# Patient Record
Sex: Male | Born: 1975 | Race: White | Hispanic: No | Marital: Single | Smoking: Current some day smoker
Health system: Southern US, Community
[De-identification: ages and names within clinical notes are randomized; demographics above are authoritative.]

## PROBLEM LIST (undated history)

## (undated) DIAGNOSIS — M1990 Unspecified osteoarthritis, unspecified site: Secondary | ICD-10-CM

## (undated) DIAGNOSIS — K602 Anal fissure, unspecified: Secondary | ICD-10-CM

## (undated) DIAGNOSIS — K649 Unspecified hemorrhoids: Secondary | ICD-10-CM

## (undated) DIAGNOSIS — N289 Disorder of kidney and ureter, unspecified: Secondary | ICD-10-CM

## (undated) DIAGNOSIS — F419 Anxiety disorder, unspecified: Secondary | ICD-10-CM

## (undated) HISTORY — PX: OTHER SURGICAL HISTORY: SHX169

## (undated) HISTORY — DX: Unspecified hemorrhoids: K64.9

## (undated) HISTORY — DX: Disorder of kidney and ureter, unspecified: N28.9

## (undated) HISTORY — PX: GANGLION CYST EXCISION: SHX1691

## (undated) HISTORY — PX: ANUS SURGERY: SHX302

## (undated) HISTORY — DX: Anal fissure, unspecified: K60.2

---

## 2004-03-27 HISTORY — PX: OTHER SURGICAL HISTORY: SHX169

## 2004-10-20 ENCOUNTER — Emergency Department: Payer: Self-pay | Admitting: General Practice
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
## 2016-08-24 ENCOUNTER — Ambulatory Visit (INDEPENDENT_AMBULATORY_CARE_PROVIDER_SITE_OTHER): Payer: Self-pay | Admitting: Nurse Practitioner

## 2016-08-24 ENCOUNTER — Other Ambulatory Visit: Payer: Self-pay | Admitting: Nurse Practitioner

## 2016-08-24 ENCOUNTER — Encounter: Payer: Self-pay | Admitting: Nurse Practitioner

## 2016-08-24 VITALS — BP 130/87 | HR 92 | Temp 99.0°F | Ht 64.0 in | Wt 136.4 lb

## 2016-08-24 DIAGNOSIS — J069 Acute upper respiratory infection, unspecified: Secondary | ICD-10-CM | POA: Diagnosis not present

## 2016-08-24 DIAGNOSIS — Z716 Tobacco abuse counseling: Secondary | ICD-10-CM

## 2016-08-24 DIAGNOSIS — Z720 Tobacco use: Secondary | ICD-10-CM

## 2016-08-24 DIAGNOSIS — F17210 Nicotine dependence, cigarettes, uncomplicated: Secondary | ICD-10-CM

## 2016-08-24 DIAGNOSIS — Z7689 Persons encountering health services in other specified circumstances: Secondary | ICD-10-CM

## 2016-08-24 DIAGNOSIS — Z0000 Encounter for general adult medical examination without abnormal findings: Secondary | ICD-10-CM

## 2016-08-24 MED ORDER — FLUTICASONE PROPIONATE 50 MCG/ACT NA SUSP: 2.0000 | Freq: Every day | NASAL | 0 refills | Status: DC

## 2016-08-24 NOTE — Progress Notes (Signed)
I have reviewed this encounter including the documentation in this note and/or discussed this patient with the provider, Wilhelmina McardleLauren Kennedy, AGPCNP-BC. I am certifying that I agree with the content of this note as supervising physician.  Saralyn PilarAlexander Karamalegos, DO [facility] 08/24/2016, 1:59 PM

## 2016-08-24 NOTE — Patient Instructions (Signed)
Justin Short, Thank you for coming in to clinic today.  1. Smoking Cessation:  --> 1-800-Quit Now is the [place] quit smoking line you can call any time.  2. Upper respiratory Infection 1. It sounds like you have a Upper Respiratory Virus - this will most likely run it's course in 7 to 10 days. Recommend good hand washing. - Continue ipratropium/ Atrovent nasal spray decongestant 2 sprays each nostril up to 4 times daily for 5-7 days - Start anti-histamine loratadine 10mg  daily for the next 2-4 weeks.   - also can use Flonase 2 sprays each nostril daily for up to 4-6 weeks  This works best after 10-14 days continuous use. - If congestion is worse, start OTC Mucinex (or may try Mucinex-DM for cough) up to 7-10 days then stop - Drink plenty of fluids to improve congestion - Drink warm herbal tea with honey for sore throat. - Start taking Tylenol extra strength 1 to 2 tablets every 6-8 hours for aches or fever/chills for next few days as needed.  Do not take more than 3,000 mg in 24 hours from all medicines.  May take Ibuprofen as well if tolerated 200-400mg  every 8 hours as needed. Do not take for longer than 2 weeks.  If symptoms significantly worsening with persistent fevers/chills despite tylenol/ibpurofen, nausea, vomiting unable to tolerate food/fluids or medicine, body aches, or shortness of breath, sinus pain pressure or worsening productive cough, then follow-up for re-evaluation, may seek more immediate care at Urgent Care or ED if more concerned for emergency.  Please schedule a follow-up appointment with Justin Short, Justin Short to Return in about 4 weeks (around 09/21/2016) for annual physical exam Friday, Labs earlier the same week.  You will be due for FASTING BLOOD WORK (no food or drink after midnight before, only water or coffee without cream/sugar on the morning of)  - Please go ahead and schedule a "Lab Only" visit in the morning at the clinic for lab draw the week of your physical at least 2  days before next Annual Physical  At your physical we would do the following tests during an annual physical:  You can call your insurance company to find out what is covered.  IF not completely covered, as about your "cost share." TSH CMP CBC Vitamin D 25 Hydroxy Lipid panel A1c ECG - electrocardiogram (electrical pathways of heart beat).  For Lab Results, once available within 2-3 days of blood draw, you can can log in to MyChart online to view your results and a brief explanation. Also, we can discuss results at next follow-up visit.  If you have any other questions or concerns, please feel free to call the clinic or send a message through MyChart. You may also schedule an earlier appointment if necessary.  Justin McardleLauren Tilford Deaton, DNP, Justin Short-BC Adult Gerontology Nurse Practitioner [facility]   Steps to Quit Smoking Smoking tobacco can be harmful to your health and can affect almost every organ in your body. Smoking puts you, and those around you, at risk for developing many serious chronic diseases. Quitting smoking is difficult, but it is one of the best things that you can do for your health. It is never too late to quit. What are the benefits of quitting smoking? When you quit smoking, you lower your risk of developing serious diseases and conditions, such as:  Lung cancer or lung disease, such as COPD.  Heart disease.  Stroke.  Heart attack.  Infertility.  Osteoporosis and bone fractures.  Additionally, symptoms such as coughing, wheezing, and shortness of breath may get better when you quit. You may also find that you get sick less often because your body is stronger at fighting off colds and infections. If you are pregnant, quitting smoking can help to reduce your chances of having a baby of low birth weight. How do I get ready to quit? When you decide to quit smoking, create a plan to make sure that you are successful. Before you quit:  Pick a date to  quit. Set a date within the next two weeks to give you time to prepare.  Write down the reasons why you are quitting. Keep this list in places where you will see it often, such as on your bathroom mirror or in your car or wallet.  Identify the people, places, things, and activities that make you want to smoke (triggers) and avoid them. Make sure to take these actions: ? Throw away all cigarettes at home, at work, and in your car. ? Throw away smoking accessories, such as Set designer. ? Clean your car and make sure to empty the ashtray. ? Clean your home, including curtains and carpets.  Tell your family, friends, and coworkers that you are quitting. Support from your loved ones can make quitting easier.  Talk with your health care provider about your options for quitting smoking.  Find out what treatment options are covered by your health insurance.  What strategies can I use to quit smoking? Talk with your healthcare provider about different strategies to quit smoking. Some strategies include:  Quitting smoking altogether instead of gradually lessening how much you smoke over a period of time. Research shows that quitting "cold [place]" is more successful than gradually quitting.  Attending in-person counseling to help you build problem-solving skills. You are more likely to have success in quitting if you attend several counseling sessions. Even short sessions of 10 minutes can be effective.  Finding resources and support systems that can help you to quit smoking and remain smoke-free after you quit. These resources are most helpful when you use them often. They can include: ? Online chats with a Veterinary surgeon. ? Telephone quitlines. ? Automotive engineer. ? Support groups or group counseling. ? Text messaging programs. ? Mobile phone applications.  Taking medicines to help you quit smoking. (If you are pregnant or breastfeeding, talk with your health care provider  first.) Some medicines contain nicotine and some do not. Both types of medicines help with cravings, but the medicines that include nicotine help to relieve withdrawal symptoms. Your health care provider may recommend: ? Nicotine patches, gum, or lozenges. ? Nicotine inhalers or sprays. ? Non-nicotine medicine that is taken by mouth.  Talk with your health care provider about combining strategies, such as taking medicines while you are also receiving in-person counseling. Using these two strategies together makes you more likely to succeed in quitting than if you used either strategy on its own. If you are pregnant or breastfeeding, talk with your health care provider about finding counseling or other support strategies to quit smoking. Do not take medicine to help you quit smoking unless told to do so by your health care provider. What things can I do to make it easier to quit? Quitting smoking might feel overwhelming at first, but there is a lot that you can do to make it easier. Take these important actions:  Reach out to your family and friends and ask that they support and encourage you  during this time. Call telephone quitlines, reach out to support groups, or work with a counselor for support.  Ask people who smoke to avoid smoking around you.  Avoid places that trigger you to smoke, such as bars, parties, or smoke-break areas at work.  Spend time around people who do not smoke.  Lessen stress in your life, because stress can be a smoking trigger for some people. To lessen stress, try: ? Exercising regularly. ? Deep-breathing exercises. ? Yoga. ? Meditating. ? Performing a body scan. This involves closing your eyes, scanning your body from head to toe, and noticing which parts of your body are particularly tense. Purposefully relax the muscles in those areas.  Download or purchase mobile phone or tablet apps (applications) that can help you stick to your quit plan by providing  reminders, tips, and encouragement. There are many free apps, such as QuitGuide from the [employer] developer for Disease Control and Prevention). You can find other support for quitting smoking (smoking cessation) through smokefree.gov and other websites.  How will I feel when I quit smoking? Within the first 24 hours of quitting smoking, you may start to feel some withdrawal symptoms. These symptoms are usually most noticeable 2-3 days after quitting, but they usually do not last beyond 2-3 weeks. Changes or symptoms that you might experience include:  Mood swings.  Restlessness, anxiety, or irritation.  Difficulty concentrating.  Dizziness.  Strong cravings for sugary foods in addition to nicotine.  Mild weight gain.  Constipation.  Nausea.  Coughing or a sore throat.  Changes in how your medicines work in your body.  A depressed mood.  Difficulty sleeping (insomnia).  After the first 2-3 weeks of quitting, you may start to notice more positive results, such as:  Improved sense of smell and taste.  Decreased coughing and sore throat.  Slower heart rate.  Lower blood pressure.  Clearer skin.  The ability to breathe more easily.  Fewer sick days.  Quitting smoking is very challenging for most people. Do not get discouraged if you are not successful the first time. Some people need to make many attempts to quit before they achieve long-term success. Do your best to stick to your quit plan, and talk with your health care provider if you have any questions or concerns. This information is not intended to replace advice given to you by your health care provider. Make sure you discuss any questions you have with your health care provider. Document Released: 03/07/2001 Document Revised: 11/09/2015 Document Reviewed: 07/28/2014 Elsevier Interactive Patient Education  2017 [employer].
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
## 2016-09-13 ENCOUNTER — Other Ambulatory Visit: Payer: 59

## 2016-09-13 DIAGNOSIS — Z0000 Encounter for general adult medical examination without abnormal findings: Secondary | ICD-10-CM

## 2016-09-13 LAB — COMPREHENSIVE METABOLIC PANEL
ALT: 17 U/L (ref 9–46)
AST: 16 U/L (ref 10–40)
Albumin: 4.3 g/dL (ref 3.6–5.1)
Alkaline Phosphatase: 95 U/L (ref 40–115)
BUN: 13 mg/dL (ref 7–25)
CO2: 23 mmol/L (ref 20–31)
Calcium: 9.4 mg/dL (ref 8.6–10.3)
Chloride: 106 mmol/L (ref 98–110)
Creat: 1.03 mg/dL (ref 0.60–1.35)
Glucose, Bld: 89 mg/dL (ref 65–99)
Potassium: 4.3 mmol/L (ref 3.5–5.3)
Sodium: 139 mmol/L (ref 135–146)
Total Bilirubin: 0.4 mg/dL (ref 0.2–1.2)
Total Protein: 6.7 g/dL (ref 6.1–8.1)

## 2016-09-13 LAB — CBC WITH DIFFERENTIAL/PLATELET
Basophils Absolute: 0 cells/uL (ref 0–200)
Basophils Relative: 0 %
Eosinophils Absolute: 228 cells/uL (ref 15–500)
Eosinophils Relative: 4 %
HCT: 45.7 % (ref 38.5–50.0)
Hemoglobin: 15 g/dL (ref 13.2–17.1)
Lymphocytes Relative: 31 %
Lymphs Abs: 1767 cells/uL (ref 850–3900)
MCH: 31.6 pg (ref 27.0–33.0)
MCHC: 32.8 g/dL (ref 32.0–36.0)
MCV: 96.4 fL (ref 80.0–100.0)
MPV: 10.4 fL (ref 7.5–12.5)
Monocytes Absolute: 627 cells/uL (ref 200–950)
Monocytes Relative: 11 %
Neutro Abs: 3078 cells/uL (ref 1500–7800)
Neutrophils Relative %: 54 %
Platelets: 278 10*3/uL (ref 140–400)
RBC: 4.74 MIL/uL (ref 4.20–5.80)
RDW: 13.3 % (ref 11.0–15.0)
WBC: 5.7 10*3/uL (ref 3.8–10.8)

## 2016-09-13 LAB — TSH: TSH: 1.47 mIU/L (ref 0.40–4.50)

## 2016-09-13 LAB — LIPID PANEL
Cholesterol: 151 mg/dL (ref <200)
HDL: 49 mg/dL (ref >40)
LDL Cholesterol: 95 mg/dL (ref <100)
Total CHOL/HDL Ratio: 3.1 Ratio (ref <5.0)
Triglycerides: 37 mg/dL (ref <150)
VLDL: 7 mg/dL (ref <30)

## 2016-09-14 LAB — HEMOGLOBIN A1C
Hgb A1c MFr Bld: 5.1 % (ref <5.7)
Mean Plasma Glucose: 100 mg/dL

## 2016-09-14 LAB — VITAMIN D 25 HYDROXY (VIT D DEFICIENCY, FRACTURES): Vit D, 25-Hydroxy: 25 ng/mL — ABNORMAL LOW (ref 30–100)

## 2016-09-15 ENCOUNTER — Ambulatory Visit (INDEPENDENT_AMBULATORY_CARE_PROVIDER_SITE_OTHER): Payer: 59 | Admitting: Nurse Practitioner

## 2016-09-15 ENCOUNTER — Encounter: Payer: Self-pay | Admitting: Nurse Practitioner

## 2016-09-15 VITALS — BP 118/77 | HR 72 | Ht 64.0 in | Wt 136.2 lb

## 2016-09-15 DIAGNOSIS — Z0000 Encounter for general adult medical examination without abnormal findings: Secondary | ICD-10-CM

## 2016-09-15 DIAGNOSIS — Z23 Encounter for immunization: Secondary | ICD-10-CM | POA: Diagnosis not present

## 2016-09-15 NOTE — Progress Notes (Signed)
Subjective:    Patient ID: Justin Short, male    DOB: [birth year], 41 y.o.   MRN: [id number]  Justin Short is a 41 y.o. male presenting on 09/15/2016 for Annual Exam   HPI Annual Exam Patient has been feeling much better from last time w/ only recent return of sinus congestion.  Plans to try Flonase now for new symptoms and does not desire to discuss further today.  Sleeps 5-7 hours per night occasionally interrupted.  Health Maintenance Weight/BMI: Healthy weight Physical activity: swimming, walking/jogging but needs to start more regularly Diet: 2-3 times out per week,  Fried foods 1-2 per week & trying to increase lean meats and vegetables.  Seatbelt: most of the time Sunscreen: regularly Prostate exam/PSA: no family history Colonoscopy: no family history Tetanus: due today  Depression screen [facility] 2/9 09/15/2016  Decreased Interest 1  Down, Depressed, Hopeless 0  PHQ - 2 Score 1  Altered sleeping 1  Tired, decreased energy 1  Change in appetite 0  Feeling bad or failure about yourself  0  Trouble concentrating 0  Moving slowly or fidgety/restless 0  Suicidal thoughts 0  PHQ-9 Score 3    Past Medical History:  Diagnosis Date  . Anal fissure   . Hemorrhoids    always present  . 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 ANUS SURGERY     Social History   Social History  . Marital status: Single    Spouse name: N/A  . Number of children: N/A  . Years of education: N/A   Occupational History  . Not on file.   Social History Main Topics  . Smoking status: Current Every Day Smoker    Packs/day: 1.00    Types: Cigarettes  . Smokeless tobacco: Never Used  . Alcohol use No  . Drug use: Yes    Frequency: 10.0 times per week    Types: Marijuana     Comment: smokes small amount each occurrence 2-3 g / week  . Sexual activity: Yes    Birth control/ protection: None       Comment: mutually monogamous male partner (w/ hysterectomy)   Other Topics Concern  . Not on file   Social History Narrative  . No narrative on file   Family History  Problem Relation Age of Onset  . Cancer Mother        skin  . Diabetes Father   . Heart disease Father        CHF  . Cancer Father        renal  . Stroke Father   . Kidney disease Father        dialysis s/p kidney surgery ? cancer  . Thyroid disease Paternal Aunt    Current Outpatient Prescriptions on File Prior to Visit  Medication Sig  . acetaminophen (TYLENOL) 325 MG tablet Take 650 mg by mouth every 6 (six) hours as needed.  . fluticasone (FLONASE) 50 MCG/ACT nasal spray Place 2 sprays into both nostrils daily. (Patient not taking: Reported on 09/15/2016)  . ibuprofen (ADVIL,MOTRIN) 200 MG tablet Take 200 mg by mouth every 6 (six) hours as needed.   No current facility-administered medications on file prior to visit.    Review of Systems  Constitutional: Negative.   HENT: Positive for rhinorrhea, sinus pain and sinus pressure.   Eyes: Positive for itching.  Respiratory: Negative.  Cardiovascular: Negative.   Gastrointestinal: Negative.   Endocrine: Negative.   Genitourinary: Negative.   Musculoskeletal: Negative.   Skin: Negative.   Allergic/Immunologic: Negative.   Neurological: Negative for headaches.  Hematological: Negative.   Psychiatric/Behavioral: Negative.    Per HPI unless specifically indicated above     Objective:    BP 118/77 (BP Location: Right Arm, Patient Position: Sitting, Cuff Size: Normal)   Pulse 72   Ht 5' 4"  (1.626 m)   Wt 136 lb 3.2 oz (61.8 kg)   BMI 23.38 kg/m    Wt Readings from Last 3 Encounters:  09/15/16 136 lb 3.2 oz (61.8 kg)  08/24/16 136 lb 6.4 oz (61.9 kg)    Physical Exam  General - healthy, well-appearing, NAD HEENT - Normocephalic, atraumatic, PERRL, EOMI, patent nares w/o congestion, oropharynx clear, MMM Neck - supple, non-tender, no LAD,  no thyromegaly Heart - RRR, bradycardia, no murmurs heard Lungs - Clear throughout all lobes, no wheezing, crackles, or rhonchi. Normal work of breathing. Abdomen - soft, NTND, no masses, no hepatosplenomegaly, active bowel sounds Extremeties - non-tender, no edema, cap refill < 2 seconds, peripheral pulses intact +2 bilaterally Skin - warm, dry, no rashes, or abnormal nevi requiring additional evaluation Neuro - awake, alert, oriented, CN II-X intact, intact muscle strength 5/5 bilaterally, intact distal sensation to light touch, normal coordination, normal gait Psych - Normal mood and affect, normal behavior    Results for orders placed or performed in visit on 09/13/16  CBC with Differential/Platelet  Result Value Ref Range   WBC 5.7 3.8 - 10.8 K/uL   RBC 4.74 4.20 - 5.80 MIL/uL   Hemoglobin 15.0 13.2 - 17.1 g/dL   HCT 45.7 38.5 - 50.0 %   MCV 96.4 80.0 - 100.0 fL   MCH 31.6 27.0 - 33.0 pg   MCHC 32.8 32.0 - 36.0 g/dL   RDW 13.3 11.0 - 15.0 %   Platelets 278 140 - 400 K/uL   MPV 10.4 7.5 - 12.5 fL   Neutro Abs 3,078 1,500 - 7,800 cells/uL   Lymphs Abs 1,767 850 - 3,900 cells/uL   Monocytes Absolute 627 200 - 950 cells/uL   Eosinophils Absolute 228 15 - 500 cells/uL   Basophils Absolute 0 0 - 200 cells/uL   Neutrophils Relative % 54 %   Lymphocytes Relative 31 %   Monocytes Relative 11 %   Eosinophils Relative 4 %   Basophils Relative 0 %   Smear Review Criteria for review not met   Comprehensive metabolic panel  Result Value Ref Range   Sodium 139 135 - 146 mmol/L   Potassium 4.3 3.5 - 5.3 mmol/L   Chloride 106 98 - 110 mmol/L   CO2 23 20 - 31 mmol/L   Glucose, Bld 89 65 - 99 mg/dL   BUN 13 7 - 25 mg/dL   Creat 1.03 0.60 - 1.35 mg/dL   Total Bilirubin 0.4 0.2 - 1.2 mg/dL   Alkaline Phosphatase 95 40 - 115 U/L   AST 16 10 - 40 U/L   ALT 17 9 - 46 U/L   Total Protein 6.7 6.1 - 8.1 g/dL   Albumin 4.3 3.6 - 5.1 g/dL   Calcium 9.4 8.6 - 10.3 mg/dL  Lipid panel  Result  Value Ref Range   Cholesterol 151 <200 mg/dL   Triglycerides 37 <150 mg/dL   HDL 49 >40 mg/dL   Total CHOL/HDL Ratio 3.1 <5.0 Ratio   VLDL 7 <30 mg/dL   LDL Cholesterol  95 <100 mg/dL  TSH  Result Value Ref Range   TSH 1.47 0.40 - 4.50 mIU/L  VITAMIN D 25 Hydroxy (Vit-D Deficiency, Fractures)  Result Value Ref Range   Vit D, 25-Hydroxy 25 (L) 30 - 100 ng/mL  Hemoglobin A1c  Result Value Ref Range   Hgb A1c MFr Bld 5.1 <5.7 %   Mean Plasma Glucose 100 mg/dL      Assessment & Plan:   Problem List Items Addressed This Visit    None    Visit Diagnoses    Encounter for annual physical exam    -  Primary Physical exam with no new findings.  Well adult with no acute concerns.  Plan: 1. Obtain health maintenance screenings. 2. Return 1 year for annual physical.    Need for Tdap vaccination     Pt does not know when last Td was administered.  He wishes to receive it today.  Plan: 1. Admin Tdap today.   Relevant Orders   Tdap vaccine greater than or equal to 7yo IM (Completed)      No orders of the defined types were placed in this encounter.    Follow up plan: Return in about 1 year (around 09/15/2017) for annual exam w/ labs 2-3 days prior.  Cassell Smiles, DNP, AGPCNP-BC Adult Gerontology Primary Care Nurse Practitioner [facility] 09/15/2016, 9:17 AM

## 2016-09-15 NOTE — Patient Instructions (Addendum)
Justin Short, Thank you for coming in to clinic today.  1. MyFitnessPal is a good tool to help you with protein intake.  Use this for about 1 week to estimate your weekly average, then add and subtract as needed for weight maintenance.  2. For your vitamin D - a daily multivitamin is enough supplementation 600-1,000 IU daily in that multivitamin.  3. LIPID PANEL: - Your HDL is a little low.  HDL is your good cholesterol.  When it is higher than 50, it can be protective against and prevent cholesterol buildup in your arteries that could lead to heart attack or stroke.   - You can raise your HDL by exercising, by taking fish oil or by increasing your dietary omega-3 fatty acids.  Omega-3s are found in olive oil, fish, and seeds and nuts like almonds, walnuts, and pecans.  Avoid saturated fats found in meat and dairy products.  Polyunsaturated fats and monounsaturated fats are better choices, but still limit your overall intake of these.  Begin eating more whole grain carbohydrates with more dietary fiber instead of "white" carbohydrates like potatoes, rice, and white bread.  START taking fish oil 1,000 mg every day or every other day.  Please schedule a follow-up appointment with Wilhelmina McardleLauren Baylen Dea, AGNP to Return in about 1 year (around 09/15/2017) for annual exam.  If you have any other questions or concerns, please feel free to call the clinic or send a message through MyChart. You may also schedule an earlier appointment if necessary.  Wilhelmina McardleLauren Khushboo Chuck, DNP, AGNP-BC Adult Gerontology Nurse Practitioner [facility], [facility]

## 2016-09-18 NOTE — Progress Notes (Signed)
I have reviewed this encounter including the documentation in this note and/or discussed this patient with the provider, Lauren Kennedy, AGPCNP-BC. I am certifying that I agree with the content of this note as supervising physician.  Alexander Karamalegos, DO [facility] 09/18/2016, 1:52 PM  

## 2017-06-08 ENCOUNTER — Telehealth: Payer: Self-pay

## 2017-06-08 DIAGNOSIS — K649 Unspecified hemorrhoids: Secondary | ICD-10-CM

## 2017-06-08 NOTE — Telephone Encounter (Signed)
Pt called requesting a referral to general surgery for hemorrhoids. He states it was previously treated in the past here and that he had a procedure at [facility].

## 2017-06-08 NOTE — Telephone Encounter (Signed)
Referral can be placed since patient has had complicated hemorrhoids in the past.  He is uninsured, so the care needs to be provided at most appropriate place.  Patient can present to PCP for assessment and prescription of Anusol suppository or cream.  Patient desires additional procedure for hemorrhoid reduction or removal, so best location is general surgery.  Patient has been treating at home with OTCs and is not improving.

## 2017-06-13 ENCOUNTER — Encounter: Payer: Self-pay | Admitting: Surgery

## 2017-06-13 ENCOUNTER — Ambulatory Visit: Payer: Self-pay | Admitting: Surgery

## 2017-06-13 VITALS — BP 125/71 | HR 85 | Ht 64.0 in | Wt 138.8 lb

## 2017-06-13 DIAGNOSIS — K645 Perianal venous thrombosis: Secondary | ICD-10-CM

## 2017-06-13 MED ORDER — LIDOCAINE (ANORECTAL) 5 % EX CREA: 1.0000 | TOPICAL_CREAM | CUTANEOUS | 0 refills | Status: DC | PRN

## 2017-06-13 MED ORDER — HYDROCORTISONE 2.5 % RE CREA: 1.0000 "application " | TOPICAL_CREAM | Freq: Two times a day (BID) | RECTAL | 0 refills | Status: DC

## 2017-06-13 NOTE — Patient Instructions (Addendum)
Follow up in 3 weeks on 06/28/17     Hemorrhoids Hemorrhoids are swollen veins in and around the rectum or anus. There are two types of hemorrhoids:  Internal hemorrhoids. These occur in the veins that are just inside the rectum. They may poke through to the outside and become irritated and painful.  External hemorrhoids. These occur in the veins that are outside of the anus and can be felt as a painful swelling or hard lump near the anus.  Most hemorrhoids do not cause serious problems, and they can be managed with home treatments such as diet and lifestyle changes. If home treatments do not help your symptoms, procedures can be done to shrink or remove the hemorrhoids. What are the causes? This condition is caused by increased pressure in the anal area. This pressure may result from various things, including:  Constipation.  Straining to have a bowel movement.  Diarrhea.  Pregnancy.  Obesity.  Sitting for long periods of time.  Heavy lifting or other activity that causes you to strain.  Anal sex.  What are the signs or symptoms? Symptoms of this condition include:  Pain.  Anal itching or irritation.  Rectal bleeding.  Leakage of stool (feces).  Anal swelling.  One or more lumps around the anus.  How is this diagnosed? This condition can often be diagnosed through a visual exam. Other exams or tests may also be done, such as:  Examination of the rectal area with a gloved hand (digital rectal exam).  Examination of the anal canal using a small tube (anoscope).  A blood test, if you have lost a significant amount of blood.  A test to look inside the colon (sigmoidoscopy or colonoscopy).  How is this treated? This condition can usually be treated at home. However, various procedures may be done if dietary changes, lifestyle changes, and other home treatments do not help your symptoms. These procedures can help make the hemorrhoids smaller or remove them  completely. Some of these procedures involve surgery, and others do not. Common procedures include:  Rubber band ligation. Rubber bands are placed at the base of the hemorrhoids to cut off the blood supply to them.  Sclerotherapy. Medicine is injected into the hemorrhoids to shrink them.  Infrared coagulation. A type of light energy is used to get rid of the hemorrhoids.  Hemorrhoidectomy surgery. The hemorrhoids are surgically removed, and the veins that supply them are tied off.  Stapled hemorrhoidopexy surgery. A circular stapling device is used to remove the hemorrhoids and use staples to cut off the blood supply to them.  Follow these instructions at home: Eating and drinking  Eat foods that have a lot of fiber in them, such as whole grains, beans, nuts, fruits, and vegetables. Ask your health care provider about taking products that have added fiber (fiber supplements).  Drink enough fluid to keep your urine clear or pale yellow. Managing pain and swelling  Take warm sitz baths for 20 minutes, 3-4 times a day to ease pain and discomfort.  If directed, apply ice to the affected area. Using ice packs between sitz baths may be helpful. ? Put ice in a plastic bag. ? Place a towel between your skin and the bag. ? Leave the ice on for 20 minutes, 2-3 times a day. General instructions  Take over-the-counter and prescription medicines only as told by your health care provider.  Use medicated creams or suppositories as told.  Exercise regularly.  Go to the bathroom when you  have the urge to have a bowel movement. Do not wait.  Avoid straining to have bowel movements.  Keep the anal area dry and clean. Use wet toilet paper or moist towelettes after a bowel movement.  Do not sit on the toilet for long periods of time. This increases blood pooling and pain. Contact a health care provider if:  You have increasing pain and swelling that are not controlled by treatment or  medicine.  You have uncontrolled bleeding.  You have difficulty having a bowel movement, or you are unable to have a bowel movement.  You have pain or inflammation outside the area of the hemorrhoids. This information is not intended to replace advice given to you by your health care provider. Make sure you discuss any questions you have with your health care provider. Document Released: 03/10/2000 Document Revised: 08/11/2015 Document Reviewed: 11/25/2014 Elsevier Interactive Patient Education  [employer].

## 2017-06-14 ENCOUNTER — Encounter: Payer: Self-pay | Admitting: Surgery

## 2017-06-14 NOTE — Progress Notes (Signed)
Surgical Clinic History and Physical  Referring provider:  Galen Manila, NP [address] [place], [place] [address]  HISTORY OF PRESENT ILLNESS (HPI):  42 y.o. male presents for evaluation of hemorrhods. Patient reports he's experienced hemorrhoidal pain and itching >10 years, including thrombectomy of a thrombosed hemorrhoid by his primary care physician, but has not previously considered hemorrhoidectomy due to his lack of health insurance. He currently states that his perianal pain became acutely worse ~15 days ago, since which time it worsened and has then subsequently improved. He reports chronic constipation and denies blood per rectum, fever/chills, N/V, CP, or SOB.  PAST MEDICAL HISTORY (PMH):  Past Medical History:  Diagnosis Date  . Anal fissure   . Hemorrhoids    always present  . Kidney disease    pt reports only one functional kidney (dx in 6th grade)     PAST SURGICAL HISTORY (PSH):  Past Surgical History:  Procedure Laterality Date  . ANUS SURGERY       MEDICATIONS:  Prior to Admission medications   Medication Sig Start Date End Date Taking? Authorizing Provider  acetaminophen (TYLENOL) 325 MG tablet Take 650 mg by mouth every 6 (six) hours as needed.   Yes [provider]  ibuprofen (ADVIL,MOTRIN) 200 MG tablet Take 200 mg by mouth every 6 (six) hours as needed.   Yes [provider]  fluticasone (FLONASE) 50 MCG/ACT nasal spray Place 2 sprays into both nostrils daily. Patient not taking: Reported on 09/15/2016 08/24/16 09/21/16  Galen Manila, NP  hydrocortisone (ANUSOL-HC) 2.5 % rectal cream Place 1 application rectally 2 (two) times daily. 06/13/17   Ancil Linsey, MD  Lidocaine, Anorectal, 5 % CREA Apply 1 Tube topically as needed. 28.3 gram tube 06/13/17   Ancil Linsey, MD     ALLERGIES:  No Known Allergies   SOCIAL HISTORY:  Social History   Socioeconomic History  . Marital status: Single    Spouse name: Not on  file  . Number of children: Not on file  . Years of education: Not on file  . Highest education level: Not on file  Occupational History  . Not on file  Social Needs  . Financial resource strain: Not on file  . Food insecurity:    Worry: Not on file    Inability: Not on file  . Transportation needs:    Medical: Not on file    Non-medical: Not on file  Tobacco Use  . Smoking status: Current Every Day Smoker    Packs/day: 1.00    Types: Cigarettes  . Smokeless tobacco: Never Used  Substance and Sexual Activity  . Alcohol use: No  . Drug use: Yes    Frequency: 10.0 times per week    Types: Marijuana    Comment: smokes small amount each occurrence 2-3 g / week  . Sexual activity: Yes    Birth control/protection: None    Comment: mutually monogamous male partner (w/ hysterectomy)  Lifestyle  . Physical activity:    Days per week: Not on file    Minutes per session: Not on file  . Stress: Not on file  Relationships  . Social connections:    Talks on phone: Not on file    Gets together: Not on file    Attends religious service: Not on file    Active member of club or organization: Not on file    Attends meetings of clubs or organizations: Not on file    Relationship  status: Not on file  . Intimate partner violence:    Fear of current or ex partner: Not on file    Emotionally abused: Not on file    Physically abused: Not on file    Forced sexual activity: Not on file  Other Topics Concern  . Not on file  Social History Narrative  . Not on file    The patient currently resides (home / rehab facility / nursing home): Home The patient normally is (ambulatory / bedbound): Ambulatory  FAMILY HISTORY:  Family History  Problem Relation Age of Onset  . Cancer Mother        skin  . Diabetes Father   . Heart disease Father        CHF  . Cancer Father        renal  . Stroke Father   . Kidney disease Father        dialysis s/p kidney surgery ? cancer  . Thyroid  disease Paternal Aunt     Otherwise negative/non-contributory.  REVIEW OF SYSTEMS:  Constitutional: denies any other weight loss, fever, chills, or sweats  Eyes: denies any other vision changes, history of eye injury  ENT: denies sore throat, hearing problems  Respiratory: denies shortness of breath, wheezing  Cardiovascular: denies chest pain, palpitations  Gastrointestinal: abdominal and anorectal pain, N/V, and bowel function as per HPI Musculoskeletal: denies any other joint pains or cramps  Skin: Denies any other rashes or skin discolorations Neurological: denies any other headache, dizziness, weakness  Psychiatric: Denies any other depression, anxiety   All other review of systems were otherwise negative   VITAL SIGNS:  @VSRANGES @     Height: 5\' 4"  (162.6 cm) Weight: 138 lb 12.8 oz (63 kg) BMI (Calculated): 23.81   PHYSICAL EXAM:  Constitutional:  -- Normal body habitus  -- Awake, alert, and oriented x3  Eyes:  -- Pupils equally round and reactive to light  -- No scleral icterus  Ear, nose, throat:  -- No jugular venous distension -- No nasal drainage, bleeding Pulmonary:  -- No crackles  -- Equal breath sounds bilaterally -- Breathing non-labored at rest Cardiovascular:  -- S1, S2 present  -- No pericardial rubs  Gastrointestinal:  -- Abdomen soft, nontender, non-distended, no guarding/rebound  -- No abdominal masses appreciated, pulsatile or otherwise Anorectal: -- Moderately tender subacute thrombosis of prolapsed hemorrhoid -- Anal sphincter tone WNL, no gross blood, no skin tags visualized Musculoskeletal and Integumentary:  -- Wounds or skin discoloration: None appreciated -- Extremities: B/L UE and LE FROM, hands and feet warm, no edema  Neurologic:  -- Motor function: Intact and symmetric -- Sensation: Intact and symmetric  Labs:  CBC:  Lab Results  Component Value Date   WBC 5.7 09/13/2016   RBC 4.74 09/13/2016   BMP:  Lab Results  Component  Value Date   GLUCOSE 89 09/13/2016   CO2 23 09/13/2016   BUN 13 09/13/2016   CREATININE 1.03 09/13/2016   CALCIUM 9.4 09/13/2016     Imaging studies: no new pertinent imaging studies available for review   Assessment/Plan:  42 y.o. male with subacute thrombosis of chronic and progressively symptomatic (painful) non-bleeding hemorrhoids, complicated by co-morbidities including chronic constipation and lack of health insurance.   - encourage adequate hydration and high-fiber diet  - also recommend once daily Colace stool softener (twice daily prn)   - prescribed 5% topical lidocaine cream and topical hydrocortisone cream prn, sitz baths TID prn  - considering chronicity of resolving  recently thrombosed hemorrhoid, thrombectomy not currently indicated  - all risks, benefits, and alternatives to elective hemorrhoidectomy were discussed with the patient, all of his questions were answered to his expressed satisfaction, patient expresses he wishes to proceed, and informed consent was accordingly obtained.   - return to clinic in 3 weeks for follow-up and to likely schedule hemorrhoidectomy  - instructed to call if any questions or concerns  All of the above recommendations were discussed with the patient, and all of patient's questions were answered to his expressed satisfaction.  Thank you for the opportunity to participate in this patient's care.  -- Scherrie GerlachJason E. Earlene Plateravis, MD, RPVI [place]: [facility] General Surgery - Partnering for exceptional care. Office: [phone]

## 2017-06-27 ENCOUNTER — Telehealth: Payer: Self-pay | Admitting: Surgery

## 2017-06-27 NOTE — Telephone Encounter (Signed)
Patient is calling, patient has some questions regarding appointment tomorrow he would like to talk to Dr. Earlene Plateravis if possible due to the patient doesn't have copay. Please call patient and advise.

## 2017-06-27 NOTE — Telephone Encounter (Signed)
Spoke with patient at this time. He would really prefer to speak with Dr.Davis if possible. I let patient know that it may be after 5 pm that he is currently running clinic.

## 2017-06-28 ENCOUNTER — Ambulatory Visit: Payer: Self-pay | Admitting: Surgery

## 2018-01-21 ENCOUNTER — Encounter: Payer: Self-pay | Admitting: Nurse Practitioner

## 2018-09-19 ENCOUNTER — Other Ambulatory Visit: Payer: Self-pay

## 2018-09-19 ENCOUNTER — Encounter: Payer: Self-pay | Admitting: Family Medicine

## 2018-09-19 ENCOUNTER — Ambulatory Visit: Payer: Self-pay | Admitting: Family Medicine

## 2018-09-19 VITALS — BP 103/58 | HR 57 | Temp 97.8°F | Ht 64.0 in | Wt 133.8 lb

## 2018-09-19 DIAGNOSIS — M65352 Trigger finger, left little finger: Secondary | ICD-10-CM

## 2018-09-19 DIAGNOSIS — M67442 Ganglion, left hand: Secondary | ICD-10-CM

## 2018-09-19 DIAGNOSIS — M79645 Pain in left finger(s): Secondary | ICD-10-CM

## 2018-09-19 MED ORDER — DICLOFENAC SODIUM 1 % TD GEL: 2.0000 g | Freq: Three times a day (TID) | TRANSDERMAL | 1 refills | Status: DC | PRN

## 2018-09-19 NOTE — Patient Instructions (Addendum)
Thank you for coming to the office today.  Stay tuned for hand specialist Dr Verita Lamb, if you have not heard 1-2 weeks then call them  [facility] Address: [address], [place], [place] [address] Hours:  9AM-5PM Phone: [phone]  ----------------------------------  Try Diclofenac topical as needed, NSAID topical, can use www.goodrx.com to get discount coupon if not covered.   Please schedule a Follow-up Appointment to: Return in about 4 weeks (around 10/17/2018), or if symptoms worsen or fail to improve, for finger pain, trigger finger.  If you have any other questions or concerns, please feel free to call the office or send a message through [place]. You may also schedule an earlier appointment if necessary.  Additionally, you may be receiving a survey about your experience at our office within a few days to 1 week by e-mail or mail. We value your feedback.  Nobie Putnam, DO [place]

## 2018-09-19 NOTE — Progress Notes (Signed)
Subjective:    Patient ID: Justin Short, male    DOB: 09-Mar-1976, 43 y.o.   MRN: [id number]  Justin Short is a 43 y.o. male presenting on 09/19/2018 for Hand Pain (chronic left pinkie finger pain. The pain has worsen of the past 2 mths.  )   PCP is Cassell Smiles, AGPCNP-BC - I am currently covering during her maternity leave.   HPI   Left 5th Finger Painful Nodule / Suspected Trigger Finger w/ Ganglion Cyst Reports problem with a painful nodule on left 5th finger original onset 2 years ago but it did not bother him, now significant change in past 2 months with worsening. He says has been busy working and playing tennis, lifting weights, some karate - nothing that has injured it or provoked it but seems to be worsening in general, sore and tender to touch, occasionally will "lock up" or finger gets "stuck". - Not tried any medication. He has one kidney, therefore he does not take NSAIDs. - Denies any redness swelling, numbness tingling, weakness, other joint nodules or abnormality   Depression screen [facility] 2/9 09/19/2018 09/15/2016  Decreased Interest 0 1  Down, Depressed, Hopeless 0 0  PHQ - 2 Score 0 1  Altered sleeping 3 1  Tired, decreased energy 1 1  Change in appetite 1 0  Feeling bad or failure about yourself  0 0  Trouble concentrating 1 0  Moving slowly or fidgety/restless 0 0  Suicidal thoughts 0 0  PHQ-9 Score 6 3  Difficult doing work/chores Not difficult at all -    Social History   Tobacco Use  . Smoking status: Current Every Day Smoker    Packs/day: 0.75    Types: Cigarettes  . Smokeless tobacco: Never Used  Substance Use Topics  . Alcohol use: Yes  . Drug use: Yes    Frequency: 10.0 times per week    Types: Marijuana    Comment: smokes small amount each occurrence 2-3 g / week    Review of Systems Per HPI unless specifically indicated above     Objective:    BP (!) 103/58   Pulse (!) 57   Temp 97.8 F (36.6 C) (Oral)   Ht 5' 4"  (1.626 m)    Wt 133 lb 12.8 oz (60.7 kg)   BMI 22.97 kg/m   Wt Readings from Last 3 Encounters:  09/19/18 133 lb 12.8 oz (60.7 kg)  06/13/17 138 lb 12.8 oz (63 kg)  09/15/16 136 lb 3.2 oz (61.8 kg)    Physical Exam Vitals signs and nursing note reviewed.  Constitutional:      General: He is not in acute distress.    Appearance: He is well-developed. He is not diaphoretic.     Comments: Well-appearing, comfortable, cooperative  HENT:     Head: Normocephalic and atraumatic.  Eyes:     General:        Right eye: No discharge.        Left eye: No discharge.     Conjunctiva/sclera: Conjunctivae normal.  Cardiovascular:     Rate and Rhythm: Normal rate.  Pulmonary:     Effort: Pulmonary effort is normal.  Musculoskeletal:     Comments: Left Hand Localized issue on proximal aspect of palmar Left 5th finger with palpable 1 mm approx smaller than pea sized firm nodule that is mobile with finger flexion moves with tendon, occasionally catches, mild tender  Skin:    General: Skin is warm and dry.  Findings: No erythema or rash.  Neurological:     Mental Status: He is alert and oriented to person, place, and time.  Psychiatric:        Behavior: Behavior normal.     Comments: Well groomed, good eye contact, normal speech and thoughts    Results for orders placed or performed in visit on 09/13/16  CBC with Differential/Platelet  Result Value Ref Range   WBC 5.7 3.8 - 10.8 K/uL   RBC 4.74 4.20 - 5.80 MIL/uL   Hemoglobin 15.0 13.2 - 17.1 g/dL   HCT 45.7 38.5 - 50.0 %   MCV 96.4 80.0 - 100.0 fL   MCH 31.6 27.0 - 33.0 pg   MCHC 32.8 32.0 - 36.0 g/dL   RDW 13.3 11.0 - 15.0 %   Platelets 278 140 - 400 K/uL   MPV 10.4 7.5 - 12.5 fL   Neutro Abs 3,078 1,500 - 7,800 cells/uL   Lymphs Abs 1,767 850 - 3,900 cells/uL   Monocytes Absolute 627 200 - 950 cells/uL   Eosinophils Absolute 228 15 - 500 cells/uL   Basophils Absolute 0 0 - 200 cells/uL   Neutrophils Relative % 54 %   Lymphocytes  Relative 31 %   Monocytes Relative 11 %   Eosinophils Relative 4 %   Basophils Relative 0 %   Smear Review Criteria for review not met   Comprehensive metabolic panel  Result Value Ref Range   Sodium 139 135 - 146 mmol/L   Potassium 4.3 3.5 - 5.3 mmol/L   Chloride 106 98 - 110 mmol/L   CO2 23 20 - 31 mmol/L   Glucose, Bld 89 65 - 99 mg/dL   BUN 13 7 - 25 mg/dL   Creat 1.03 0.60 - 1.35 mg/dL   Total Bilirubin 0.4 0.2 - 1.2 mg/dL   Alkaline Phosphatase 95 40 - 115 U/L   AST 16 10 - 40 U/L   ALT 17 9 - 46 U/L   Total Protein 6.7 6.1 - 8.1 g/dL   Albumin 4.3 3.6 - 5.1 g/dL   Calcium 9.4 8.6 - 10.3 mg/dL  Lipid panel  Result Value Ref Range   Cholesterol 151 <200 mg/dL   Triglycerides 37 <150 mg/dL   HDL 49 >40 mg/dL   Total CHOL/HDL Ratio 3.1 <5.0 Ratio   VLDL 7 <30 mg/dL   LDL Cholesterol 95 <100 mg/dL  TSH  Result Value Ref Range   TSH 1.47 0.40 - 4.50 mIU/L  VITAMIN D 25 Hydroxy (Vit-D Deficiency, Fractures)  Result Value Ref Range   Vit D, 25-Hydroxy 25 (L) 30 - 100 ng/mL  Hemoglobin A1c  Result Value Ref Range   Hgb A1c MFr Bld 5.1 <5.7 %   Mean Plasma Glucose 100 mg/dL      Assessment & Plan:   Problem List Items Addressed This Visit    None    Visit Diagnoses    Ganglion of flexor tendon sheath of left little finger    -  Primary   Relevant Orders   Ambulatory referral to Orthopedic Surgery   Pain of finger of left hand       Relevant Medications   diclofenac sodium (VOLTAREN) 1 % GEL   Other Relevant Orders   Ambulatory referral to Orthopedic Surgery   Trigger little finger of left hand       Relevant Medications   diclofenac sodium (VOLTAREN) 1 % GEL   Other Relevant Orders   Ambulatory referral to Orthopedic Surgery  Clinically nodular deformity seems within L palmar 5th finger flexion tendon sheath Chronic problem, recent flare up  Try rx topical diclofenac PRN. Due to limitation with oral NSAID, one kidney.  Referral to [facility]  [place] to Dr Verita Lamb for Left hand 5th finger trigger finger with painful tendon sheath nodule, may warrant surgical intervention, chronic for >2 years now worsening in past 2 months   Meds ordered this encounter  Medications  . diclofenac sodium (VOLTAREN) 1 % GEL    Sig: Apply 2 g topically 3 (three) times daily as needed. For finger pain    Dispense:  100 g    Refill:  1    Orders Placed This Encounter  Procedures  . Ambulatory referral to Orthopedic Surgery    Referral Priority:   Routine    Referral Type:   Surgical    Referral Reason:   Specialty Services Required    Referred to Provider:   Rutherford Limerick, MD    Requested Specialty:   Orthopedic Surgery    Number of Visits Requested:   1     Follow up plan: Return in about 4 weeks (around 10/17/2018), or if symptoms worsen or fail to improve, for finger pain, trigger finger.   Nobie Putnam, [facility] 09/19/2018, 10:41 AM

## 2018-10-01 ENCOUNTER — Encounter: Payer: Self-pay | Admitting: Family Medicine

## 2018-10-01 ENCOUNTER — Other Ambulatory Visit: Payer: Self-pay

## 2018-10-01 ENCOUNTER — Ambulatory Visit (INDEPENDENT_AMBULATORY_CARE_PROVIDER_SITE_OTHER): Payer: BLUE CROSS/BLUE SHIELD | Admitting: Family Medicine

## 2018-10-01 ENCOUNTER — Telehealth: Payer: Self-pay | Admitting: *Deleted

## 2018-10-01 DIAGNOSIS — J029 Acute pharyngitis, unspecified: Secondary | ICD-10-CM

## 2018-10-01 DIAGNOSIS — J00 Acute nasopharyngitis [common cold]: Secondary | ICD-10-CM | POA: Diagnosis not present

## 2018-10-01 DIAGNOSIS — Z20822 Contact with and (suspected) exposure to covid-19: Secondary | ICD-10-CM

## 2018-10-01 MED ORDER — IPRATROPIUM BROMIDE 0.06 % NA SOLN: 2.0000 | Freq: Four times a day (QID) | NASAL | 0 refills | Status: DC

## 2018-10-01 NOTE — Telephone Encounter (Signed)
Request COVID19 Testing Received: Today Message Contents  Justin Hauser, Justin Short  [facility] Justin Short (Justin Short)   43 y.o., 06-16-1975, Justin Short  MRN: [id number]   [phone] [facility])   MyChart active.   Virtual visit today 7/7, some mild symptoms sore throat cough, requesting COVID19 testing. [place] site.   Justin Short, [facility]  10/01/2018, 1:36 PM

## 2018-10-01 NOTE — Telephone Encounter (Signed)
Patient called and scheduled for testing at [facility] location on 10/02/18. Pt advised to wear a mask and to remain in car at appt time. Understanding verbalized.

## 2018-10-01 NOTE — Patient Instructions (Addendum)
Stay tuned for a call back from the [place] [facility] - they will call you and arrange this today. If you do not hear back, can call them at [phone].  If negative test - they will call you with result. If abnormal or positive test you will be notified as well and our office will contact you to help further with treatment plan.  Your symptoms are mild at this time, and may not warrant any treatment, and this may not be COVID19. To be determined right now.  Work note in on [place], notify us if you need a new one to release you to back to work.  Start Atrovent nasal spray decongestant 2 sprays in each nostril up to 4 times daily for 7 days  Call in 48 hours - Thursday morning if worsening symptoms and may need antibiotic for strep or sinus.  REQUIRED self quarantine to [place] - advised to avoid all exposure with others while during treatment. Should continue to quarantine for up to 7-14 days, pending resolution of symptoms, if symptoms resolve by 7 days and is afebrile >3 days - may STOP self quarantine at that time.  If symptoms do not resolve or significantly improve OR if WORSENING - fever / cough - or worsening shortness of breath - then should contact us and seek advice on next steps in treatment at home vs where/when to seek care at Urgent Care or Hospital ED for further intervention  Please schedule a Follow-up Appointment to: Return in about 1 week (around 10/08/2018), or if symptoms worsen or fail to improve, for sore throat.  If you have any other questions or concerns, please feel free to call the office or send a message through [place]. You may also schedule an earlier appointment if necessary.  Additionally, you may be receiving a survey about your experience at our office within a few days to 1 week by e-mail or mail. We value your feedback.  Nobie Putnam, DO [place]

## 2018-10-01 NOTE — Progress Notes (Signed)
Virtual Visit via Telephone The purpose of this virtual visit is to provide medical care while limiting exposure to the novel coronavirus (COVID19) for both patient and office staff.  Consent was obtained for phone visit:  Yes.   Answered questions that patient had about telehealth interaction:  Yes.   I discussed the limitations, risks, security and privacy concerns of performing an evaluation and management service by telephone. I also discussed with the patient that there may be a patient responsible charge related to this service. The patient expressed understanding and agreed to proceed.  Patient Location: Home Provider Location: [facility] (Office)  PCP is Wilhelmina McardleLauren Kennedy, AGPCNP-BC - I am currently covering during her maternity leave.   ---------------------------------------------------------------------- Chief Complaint  Patient presents with  . Sore Throat    osnet 3 days denies fever, smokes so has some cough, head congestion, painful swallowing more left side     S: Reviewed CMA documentation. I have called patient and gathered additional HPI as follows:  SORE THROAT / COUGH CONGESTION Reports that symptoms started 2-3 days ago with sore throat with soreness and sinus congestion drainage affecting L side of his throat more, asking if it could be tonsils, similar issue in the past. He thought related to smoking, he has had strep throat or tonsillitis in past. - Tried OTC Ibuprofen Tylenol PRN some relief - He is asking about testing for COVID19, out of work yesterday and high risk family member would like to get tested - has nasonex not using  Denies any high risk travel to areas of current concern for COVID19. Denies any known or suspected exposure to person with or possibly with COVID19.  Denies any fevers, chills, sweats, body ache, shortness of breath, sinus pain, abdominal pain, diarrhea, loss of  taste  -------------------------------------------------------------------------- O: No physical exam performed due to remote telephone encounter.  --------------------------------------------------------------------------- A&P:  POSSIBLE COVID19 vs Sore Throat Acute Pharyngitis vs Acute Rhinosinusitis Possible for benign viral etiology at onset, may have allergic symptoms and drainage causing sore throat - Reassuring without high risk symptoms - Afebrile, without dyspnea - No comorbid pulmonary conditions (asthma, COPD) or immunocompromise  [facility] - they will contact patient via telephone and mychart to proceed with arranging date/time testing. OTC medications recommended for symptoms. Recommend Tylenol PRN for fever or other viral symptoms Start Atrovent nasal spray decongestant 2 sprays in each nostril up to 4 times daily for 7 days Note for work - written, on Clinical cytogeneticistmychart, with return to work strategy if negative test and resolving symptoms fever free >3 days  No orders of the defined types were placed in this encounter.   REQUIRED self quarantine to AVOID POTENTIAL SPREAD - advised to avoid all exposure with others while during TESTING (Pending result) and treatment. Should continue to quarantine for up to 7-14 days - pending resolution of symptoms, if TEST IS NEGATIVE and symptoms resolve by 7 days and is afebrile >3 days - may STOP self quarantine at that time. IF test is POSITIVE then will require 10 additional day quarantine after date of positive test result.  If symptoms do not resolve or significantly improve OR if WORSENING - fever / cough - or worsening shortness of breath - then should contact us and seek advice on next steps in treatment at home vs where/when to seek care at Urgent Care or Hospital ED for further intervention and possible testing if indicated.  Patient verbalizes understanding with the above  medical  recommendations including the limitation of remote medical advice.  Specific follow-up / call-back criteria were given for patient to follow-up or seek medical care more urgently if needed.  - Time spent in direct consultation with patient on phone: 12 minutes  Nobie Putnam, [facility] 10/01/2018, 1:22 PM

## 2018-10-02 ENCOUNTER — Other Ambulatory Visit: Payer: Self-pay

## 2018-10-02 DIAGNOSIS — Z20822 Contact with and (suspected) exposure to covid-19: Secondary | ICD-10-CM

## 2018-10-03 ENCOUNTER — Ambulatory Visit: Payer: Self-pay

## 2018-10-03 NOTE — Telephone Encounter (Signed)
Patient called to ask about the testing that was done and asks if it's accurate or should it be redone. He says they used 2 swabs and only swabbed the inside of his nose and did not use the long Q-tip to go all the way up in his nose. I advised the testing sites are using that way to swab now for the community. He asked is it as effective, I advised that tests are coming back as adequate samples, so as far as I know it's accurate. Advised him to do his own research on the CDC website and maybe there is something there to assist him.

## 2018-10-07 ENCOUNTER — Telehealth: Payer: Self-pay

## 2018-10-07 LAB — NOVEL CORONAVIRUS, NAA: SARS-CoV-2, NAA: NOT DETECTED

## 2018-10-07 NOTE — Telephone Encounter (Signed)
Spoke with patient . 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 He had issues last year as well.  He was instructed to be seen in the emergency room if he could not wait until tomorrow. Also insturcted to do sitz baths and cold pack. Appointment with Dr.Piscoya 10/08/18 @ 10:30.

## 2018-10-08 ENCOUNTER — Other Ambulatory Visit: Payer: Self-pay

## 2018-10-08 ENCOUNTER — Encounter: Payer: Self-pay | Admitting: Surgery

## 2018-10-08 ENCOUNTER — Ambulatory Visit (INDEPENDENT_AMBULATORY_CARE_PROVIDER_SITE_OTHER): Payer: BLUE CROSS/BLUE SHIELD | Admitting: Surgery

## 2018-10-08 VITALS — BP 135/80 | HR 99 | Temp 97.7°F | Resp 16 | Ht 64.0 in | Wt 131.4 lb

## 2018-10-08 DIAGNOSIS — K644 Residual hemorrhoidal skin tags: Secondary | ICD-10-CM | POA: Insufficient documentation

## 2018-10-08 DIAGNOSIS — K648 Other hemorrhoids: Secondary | ICD-10-CM | POA: Diagnosis not present

## 2018-10-08 NOTE — Patient Instructions (Addendum)
Try Miralax daily to avoid hard stools and constipation. Continue to use the medications you were recently prescribed.  Fiber Content in foods. Continue to take sitz baths.     Surgical Procedures for Hemorrhoids, Care After This sheet gives you information about how to care for yourself after your procedure. Your health care provider may also give you more specific instructions. If you have problems or questions, contact your health care provider. What can I expect after the procedure? After the procedure, it is common to have:  Rectal pain.  Pain when you are having a bowel movement.  Slight rectal bleeding. This is more likely to happen with the first bowel movement after surgery. Follow these instructions at home: Medicines  Take over-the-counter and prescription medicines only as told by your health care provider.  If you were prescribed an antibiotic medicine, use it as told by your health care provider. Do not stop using the antibiotic even if your condition improves.  Ask your health care provider if the medicine prescribed to you requires you to avoid driving or using heavy machinery.  Use a stool softener or a bulk laxative as told by your health care provider. Eating and drinking  Follow instructions from your health care provider about what to eat or drink after your procedure.  You may need to take actions to prevent or treat constipation, such as: ? Drink enough fluid to keep your urine pale yellow. ? Take over-the-counter or prescription medicines. ? Eat foods that are high in fiber, such as beans, whole grains, and fresh fruits and vegetables. ? Limit foods that are high in fat and processed sugars, such as fried or sweet foods. Activity   Rest as told by your health care provider.  Avoid sitting for a long time without moving. Get up to take short walks every 1-2 hours. This is important to improve blood flow and breathing. Ask for help if you feel weak or  unsteady.  Return to your normal activities as told by your health care provider. Ask your health care provider what activities are safe for you.  Do not lift anything that is heavier than 10 lb (4.5 kg), or the limit that you are told, until your health care provider says that it is safe.  Do not strain to have a bowel movement.  Do not spend a long time sitting on the toilet. General instructions   Take warm sitz baths for 15-20 minutes, 2-3 times a day to relieve soreness or itching and to keep the rectal area clean.  Apply ice packs to the area to reduce swelling and pain.  Do not drive for 24 hours if you were given a sedative during your procedure.  Keep all follow-up visits as told by your health care provider. This is important. Contact a health care provider if:  Your pain medicine is not helping.  You have a fever or chills.  You have bad smelling drainage.  You have a lot of swelling.  You become constipated.  You have trouble passing urine. Get help right away if:  You have very bad rectal pain.  You have heavy bleeding from your rectum. Summary  After the procedure, it is common to have pain and slight rectal bleeding.  Take warm sitz baths for 15-20 minutes, 2-3 times a day to relieve soreness or itching and to keep the rectal area clean.  Avoid straining when having a bowel movement.  Eat foods that are high in fiber, such as  beans, whole grains, and fresh fruits and vegetables.  Take over-the-counter and prescription medicines only as told by your health care provider. This information is not intended to replace advice given to you by your health care provider. Make sure you discuss any questions you have with your health care provider. Document Released: 06/03/2003 Document Revised: 01/29/2018 Document Reviewed: 01/29/2018 Elsevier Patient Education  2020 [employer]  How to Take a I[employer] IndustriesSitz Bath A sitz bath is a warm water bath that may be used to  care for your rectum, genital area, or the area between your rectum and genitals (perineum). For a sitz bath, the water only comes up to your hips and covers your buttocks. A sitz bath may done at home in a bathtub or with a portable sitz bath that fits over the toilet. Your health care provider may recommend a sitz bath to help:  Relieve pain and discomfort after delivering a baby.  Relieve pain and itching from hemorrhoids or anal fissures.  Relieve pain after certain surgeries.  Relax muscles that are sore or tight. How to take a sitz bath Take 3-4 sitz baths a day, or as many as told by your health care provider. Bathtub sitz bath To take a sitz bath in a bathtub: 1. Partially fill a bathtub with warm water. The water should be deep enough to cover your hips and buttocks when you are sitting in the tub. 2. If your health care provider told you to put medicine in the water, follow his or her instructions. 3. Sit in the water. 4. Open the tub drain a little, and leave it open during your bath. 5. Turn on the warm water again, enough to replace the water that is draining out. Keep the water running throughout your bath. This helps keep the water at the right level and the right temperature. 6. Soak in the water for 15-20 minutes, or as long as told by your health care provider. 7. When you are done, be careful when you stand up. You may feel dizzy. 8. After the sitz bath, pat yourself dry. Do not rub your skin to dry it.  Over-the-toilet sitz bath To take a sitz bath with an over-the-toilet basin: 1. Follow the manufacturer's instructions. 2. Fill the basin with warm water. 3. If your health care provider told you to put medicine in the water, follow his or her instructions. 4. Sit on the seat. Make sure the water covers your buttocks and perineum. 5. Soak in the water for 15-20 minutes, or as long as told by your health care provider. 6. After the sitz bath, pat yourself dry. Do not  rub your skin to dry it. 7. Clean and dry the basin between uses. 8. Discard the basin if it cracks, or according to the manufacturer's instructions. Contact a health care provider if:  Your symptoms get worse. Do not continue with sitz baths if your symptoms get worse.  You have new symptoms. If this happens, do not continue with sitz baths until you talk with your health care provider. Summary  A sitz bath is a warm water bath in which the water only comes up to your hips and covers your buttocks.  A sitz bath may help relieve itching, relieve pain, and relax muscles that are sore or tight in the lower part of your body, including your genital area.  Take 3-4 sitz baths a day, or as many as told by your health care provider. Soak in the water for  15-20 minutes.  Do not continue with sitz baths if your symptoms get worse. This information is not intended to replace advice given to you by your health care provider. Make sure you discuss any questions you have with your health care provider. Document Released: 12/04/2003 Document Revised: 03/15/2017 Document Reviewed: 03/15/2017 Elsevier Patient Education  2020 Elsevier Inc.        [employer]iber Content in Foods  See the following list for the dietary fiber content of some common foods. High-fiber foods High-fiber foods contain 4 grams or more (4g or more) of fiber per serving. They include:  Artichoke (fresh) - 1 medium has 10.3g of fiber.  Baked beans, plain or vegetarian (canned) -  cup has 5.2g of fiber.  Blackberries or raspberries (fresh) -  cup has 4g of fiber.  Bran cereal -  cup has 8.6g of fiber.  Bulgur (cooked) -  cup has 4g of fiber.  Kidney beans (canned) -  cup has 6.8g of fiber.  Lentils (cooked) -  cup has 7.8g of fiber.  Pear (fresh) - 1 medium has 5.1g of fiber.  Peas (frozen) -  cup has 4.4g of fiber.  Pinto beans (canned) -  cup has 5.5g of fiber.  Pinto beans (dried and cooked) -  cup has  7.7g of fiber.  Potato with skin (baked) - 1 medium has 4.4g of fiber.  Quinoa (cooked) -  cup has 5g of fiber.  Soybeans (canned, frozen, or fresh) -  cup has 5.1g of fiber. Moderate-fiber foods Moderate-fiber foods contain 1-4 grams (1-4g) of fiber per serving. They include:  Almonds - 1 oz. has 3.5g of fiber.  Apple with skin - 1 medium has 3.3g of fiber.  Applesauce, sweetened -  cup has 1.5g of fiber.  Bagel, plain - one 4-inch (10-cm) bagel has 2g of fiber.  Banana - 1 medium has 3.1g of fiber.  Broccoli (cooked) -  cup has 2.5g of fiber.  Carrots (cooked) -  cup has 2.3g of fiber.  Corn (canned or frozen) -  cup has 2.1g of fiber.  Corn tortilla - one 6-inch (15-cm) tortilla has 1.5g of fiber.  Green beans (canned) -  cup has 2g of fiber.  Instant oatmeal -  cup has about 2g of fiber.  Long-grain brown rice (cooked) - 1 cup has 3.5g of fiber.  Macaroni, enriched (cooked) - 1 cup has 2.5g of fiber.  Melon - 1 cup has 1.4g of fiber.  Multigrain cereal -  cup has about 2-4g of fiber.  Orange - 1 small has 3.1g of fiber.  Potatoes, mashed -  cup has 1.6g of fiber.  Raisins - 1/4 cup has 1.6g of fiber.  Squash -  cup has 2.9g of fiber.  Sunflower seeds -  cup has 1.1g of fiber.  Tomato - 1 medium has 1.5g of fiber.  Vegetable or soy patty - 1 has 3.4g of fiber.  Whole-wheat bread - 1 slice has 2g of fiber.  Whole-wheat spaghetti -  cup has 3.2g of fiber. Low-fiber foods Low-fiber foods contain less than 1 gram (less than 1g) of fiber per serving. They include:  Egg - 1 large.  Flour tortilla - one 6-inch (15-cm) tortilla.  Fruit juice -  cup.  Lettuce - 1 cup.  Meat, poultry, or fish - 1 oz.  Milk - 1 cup.  Spinach (raw) - 1 cup.  White bread - 1 slice.  White rice -  cup.  Yogurt -  cup. Actual amounts  of fiber in foods may be different depending on processing. Talk with your dietitian about how much fiber you need in  your diet. This information is not intended to replace advice given to you by your health care provider. Make sure you discuss any questions you have with your health care provider. Document Released: 07/30/2006 Document Revised: 11/04/2015 Document Reviewed: 05/06/2015 Elsevier Patient Education  2020 [employer].

## 2018-10-08 NOTE — Progress Notes (Signed)
10/08/2018  History of Present Illness: Justin Short is a 43 y.o. male presenting for evaluation of perianal pain x 3 days.  He had previously seen Dr Rosana Hoes in 05/2017 for thrombosed external hemorrhoid, and due to timing of symptoms, was treated conservatively only.  He did not seek any surgery after symptoms had resolved.  He now presents with new onset of pain at the left external hemorrhoid site since 7/11.  He started using hydrocortisone ointment and lidocaine ointment that he still has from last year and reports that his pain has improved today.  Denies any drainage of fluid, and perhaps only some blood on the toilet paper.  Denies any fevers, chills, chest pain, shortness of breath, abdominal pain.  Past Medical History: Past Medical History:  Diagnosis Date  . Anal fissure   . Hemorrhoids    always present  . 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 ANUS SURGERY      Home Medications: Prior to Admission medications   Medication Sig Start Date End Date Taking? Authorizing Provider  acetaminophen (TYLENOL) 325 MG tablet Take 650 mg by mouth every 6 (six) hours as needed.   Yes [provider]  hydrocortisone (ANUSOL-HC) 2.5 % rectal cream Place 1 application rectally 2 (two) times daily. 06/13/17  Yes Vickie Epley, MD  Lidocaine, Anorectal, 5 % CREA Apply 1 Tube topically as needed. 28.3 gram tube 06/13/17  Yes Vickie Epley, MD  sildenafil (REVATIO) 20 MG tablet Take 2 tablets by mouth one hour prior to sex as needed. Do not take more than one dose every 24 hours. 06/16/18   [provider]    Allergies: No Known Allergies  Review of Systems: Review of Systems  Constitutional: Negative for chills and fever.  Respiratory: Negative for shortness of breath.   Cardiovascular: Negative for chest pain.  Gastrointestinal: Negative for  abdominal pain, nausea and vomiting.    Physical Exam BP 135/80   Pulse 99   Temp 97.7 F (36.5 C) (Temporal)   Resp 16   Ht 5\' 4"  (1.626 m)   Wt 131 lb 6.4 oz (59.6 kg)   SpO2 98%   BMI 22.55 kg/m  CONSTITUTIONAL: No acute distress HEENT:  Normocephalic, atraumatic, extraocular motion intact. RESPIRATORY:  Lungs are clear, and breath sounds are equal bilaterally. Normal respiratory effort without pathologic use of accessory muscles. CARDIOVASCULAR: Heart is regular without murmurs, gallops, or rubs. GI: The abdomen is soft, non-distended, non-tender. RECTAL:  External exam reveals mildly enlarged but soft left lateral external hemorrhoid, without any erythema or induration, but is tender to palpation.  Right posterior external hemorrhoid with some congestion but no thrombosis, tenderness, erythema, or induration.  Digital exam shows enlarged internal component of left lateral column.  No gross blood.  No fissures noted. NEUROLOGIC:  Motor and sensation is grossly normal.  Cranial nerves are grossly intact. PSYCH:  Alert and oriented to person, place and time. Affect is normal.  Labs/Imaging: None recently.  Assessment and Plan: This is a 43 y.o. male with likely a repeat flare-up of external hemorrhoid.  Discussed with patient that currently his management with hydrocortisone ointment has been working as the tissue is not indurated or swollen.  He can continue this management until his symptoms resolve. He can also do Sitz baths and take MiraLax as needed to keep his bowel  movements soft without any straining.  He will follow up in two weeks to see how his progress is.  We discussed that if the inflammation has resolved, we can schedule him for exam under anesthesia and hemorrhoidectomy.  We will discuss details of surgery on the next visit.  Face-to-face time spent with the patient and care providers was 15 minutes, with more than 50% of the time spent counseling, educating, and  coordinating care of the patient.     Howie IllJose Luis Rosary Filosa, MD [facility]

## 2018-10-23 ENCOUNTER — Ambulatory Visit: Payer: Self-pay | Admitting: Surgery

## 2018-12-20 ENCOUNTER — Encounter: Payer: Self-pay | Admitting: *Deleted

## 2020-10-12 ENCOUNTER — Other Ambulatory Visit (HOSPITAL_COMMUNITY)
Admission: RE | Admit: 2020-10-12 | Discharge: 2020-10-12 | Disposition: A | Discharge status: Home / Self Care | Payer: No Typology Code available for payment source | Source: Ambulatory Visit | Attending: Internal Medicine | Admitting: Internal Medicine

## 2020-10-12 ENCOUNTER — Ambulatory Visit: Payer: BLUE CROSS/BLUE SHIELD | Admitting: Internal Medicine

## 2020-10-12 ENCOUNTER — Encounter: Payer: Self-pay | Admitting: Internal Medicine

## 2020-10-12 ENCOUNTER — Other Ambulatory Visit: Payer: Self-pay

## 2020-10-12 VITALS — BP 127/78 | HR 76 | Temp 97.1°F | Resp 17 | Ht 64.0 in | Wt 142.4 lb

## 2020-10-12 DIAGNOSIS — F32A Depression, unspecified: Secondary | ICD-10-CM | POA: Diagnosis not present

## 2020-10-12 DIAGNOSIS — Z113 Encounter for screening for infections with a predominantly sexual mode of transmission: Secondary | ICD-10-CM

## 2020-10-12 DIAGNOSIS — Z0001 Encounter for general adult medical examination with abnormal findings: Secondary | ICD-10-CM

## 2020-10-12 DIAGNOSIS — F419 Anxiety disorder, unspecified: Secondary | ICD-10-CM | POA: Diagnosis not present

## 2020-10-12 NOTE — Assessment & Plan Note (Signed)
He is currently working with a Investment banker, corporate offered

## 2020-10-12 NOTE — Patient Instructions (Signed)
Health Maintenance, Male Adopting a healthy lifestyle and getting preventive care are important in promoting health and wellness. Ask your health care provider about: The right schedule for you to have regular tests and exams. Things you can do on your own to prevent diseases and keep yourself healthy. What should I know about diet, weight, and exercise? Eat a healthy diet  Eat a diet that includes plenty of vegetables, fruits, low-fat dairy products, and lean protein. Do not eat a lot of foods that are high in solid fats, added sugars, or sodium.  Maintain a healthy weight Body mass index (BMI) is a measurement that can be used to identify possible weight problems. It estimates body fat based on height and weight. Your health care provider can help determine your BMI and help you achieve or maintain ahealthy weight. Get regular exercise Get regular exercise. This is one of the most important things you can do for your health. Most adults should: Exercise for at least 150 minutes each week. The exercise should increase your heart rate and make you sweat (moderate-intensity exercise). Do strengthening exercises at least twice a week. This is in addition to the moderate-intensity exercise. Spend less time sitting. Even light physical activity can be beneficial. Watch cholesterol and blood lipids Have your blood tested for lipids and cholesterol at 44 years of age, then havethis test every 5 years. You may need to have your cholesterol levels checked more often if: Your lipid or cholesterol levels are high. You are older than 44 years of age. You are at high risk for heart disease. What should I know about cancer screening? Many types of cancers can be detected early and may often be prevented. Depending on your health history and family history, you may need to have cancer screening at various ages. This may include screening for: Colorectal cancer. Prostate cancer. Skin cancer. Lung  cancer. What should I know about heart disease, diabetes, and high blood pressure? Blood pressure and heart disease High blood pressure causes heart disease and increases the risk of stroke. This is more likely to develop in people who have high blood pressure readings, are of African descent, or are overweight. Talk with your health care provider about your target blood pressure readings. Have your blood pressure checked: Every 3-5 years if you are 18-39 years of age. Every year if you are 40 years old or older. If you are between the ages of 65 and 75 and are a current or former smoker, ask your health care provider if you should have a one-time screening for abdominal aortic aneurysm (AAA). Diabetes Have regular diabetes screenings. This checks your fasting blood sugar level. Have the screening done: Once every three years after age 45 if you are at a normal weight and have a low risk for diabetes. More often and at a younger age if you are overweight or have a high risk for diabetes. What should I know about preventing infection? Hepatitis B If you have a higher risk for hepatitis B, you should be screened for this virus. Talk with your health care provider to find out if you are at risk forhepatitis B infection. Hepatitis C Blood testing is recommended for: Everyone born from 1945 through 1965. Anyone with known risk factors for hepatitis C. Sexually transmitted infections (STIs) You should be screened each year for STIs, including gonorrhea and chlamydia, if: You are sexually active and are younger than 44 years of age. You are older than 44 years of age   and your health care provider tells you that you are at risk for this type of infection. Your sexual activity has changed since you were last screened, and you are at increased risk for chlamydia or gonorrhea. Ask your health care provider if you are at risk. Ask your health care provider about whether you are at high risk for HIV.  Your health care provider may recommend a prescription medicine to help prevent HIV infection. If you choose to take medicine to prevent HIV, you should first get tested for HIV. You should then be tested every 3 months for as long as you are taking the medicine. Follow these instructions at home: Lifestyle Do not use any products that contain nicotine or tobacco, such as cigarettes, e-cigarettes, and chewing tobacco. If you need help quitting, ask your health care provider. Do not use street drugs. Do not share needles. Ask your health care provider for help if you need support or information about quitting drugs. Alcohol use Do not drink alcohol if your health care provider tells you not to drink. If you drink alcohol: Limit how much you have to 0-2 drinks a day. Be aware of how much alcohol is in your drink. In the [place] one drink equals one 12 oz bottle of beer (355 mL), one 5 oz glass of wine (148 mL), or one 1 oz glass of hard liquor (44 mL). General instructions Schedule regular health, dental, and eye exams. Stay current with your vaccines. Tell your health care provider if: You often feel depressed. You have ever been abused or do not feel safe at home. Summary Adopting a healthy lifestyle and getting preventive care are important in promoting health and wellness. Follow your health care provider's instructions about healthy diet, exercising, and getting tested or screened for diseases. Follow your health care provider's instructions on monitoring your cholesterol and blood pressure. This information is not intended to replace advice given to you by your health care provider. Make sure you discuss any questions you have with your healthcare provider. Document Revised: 03/06/2018 Document Reviewed: 03/06/2018 Elsevier Patient Education  2022 [employer]vier Inc.  

## 2020-10-12 NOTE — Progress Notes (Signed)
Subjective:    Patient ID: Justin Short, male    DOB: [birth year], 45 y.o.   MRN: [id number]  HPI  Pt present to the clinic today for his annual exam.   Anxiety and Depression: Chronic, currently working a therapist. He is not taking any medications for this. He denies SI/HI.  Flu: never Tetanus: 08/2016 Covid: never Dentist: biannually  Diet: He does eat meat. He consumes fruits and veggies. He does eat some fried foods. He drinks mostly coconut water, soda. 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     hydrocortisone (ANUSOL-HC) 2.5 % rectal cream Place 1 application rectally 2 (two) times daily. 30 g 0   Lidocaine, Anorectal, 5 % CREA Apply 1 Tube topically as needed. 28.3 gram tube 1 Tube 0   sildenafil (REVATIO) 20 MG tablet Take 2 tablets by mouth one hour prior to sex as needed. Do not take more than one dose every 24 hours.     No current facility-administered medications for this visit.    No Known Allergies  Family History  Problem Relation Age of Onset   Cancer Mother        skin   Diabetes Father    Heart disease Father        CHF   Cancer Father        renal   Stroke Father    Kidney disease Father        dialysis s/p kidney surgery ? cancer   Thyroid disease Paternal Aunt     Social History   Socioeconomic History   Marital status: Single    Spouse name: Not on file   Number of children: Not on file   Years of education: Not on file   Highest education level: Not on file  Occupational History   Not on file  Tobacco Use   Smoking status: Every Day    Packs/day: 0.75    Types: Cigarettes    Smokeless tobacco: Never  Vaping Use   Vaping Use: Some days  Substance and Sexual Activity   Alcohol use: Yes   Drug use: Yes    Frequency: 10.0 times per week    Types: Marijuana    Comment: smokes small amount each occurrence 2-3 g / week   Sexual activity: Yes    Birth control/protection: None    Comment: mutually monogamous male partner (w/ hysterectomy)  Other Topics Concern   Not on file  Social History Narrative   Not on file   Social Determinants of Health   Financial Resource Strain: Not on file  Food Insecurity: Not on file  Transportation Needs: Not on file  Physical Activity: Not on file  Stress: Not on file  Social Connections: Not on file  Intimate Partner Violence: Not on file     Constitutional: Denies fever, malaise, fatigue, headache or abrupt weight changes.  HEENT: Denies eye pain, eye redness, ear pain, ringing in the ears, wax buildup, runny nose, nasal congestion, bloody nose, or sore throat. Respiratory: Denies difficulty breathing, shortness of breath, cough or sputum  production.   Cardiovascular: Denies chest pain, chest tightness, palpitations or swelling in the hands or feet.  Gastrointestinal: Denies abdominal pain, bloating, constipation, diarrhea or blood in the stool.  GU: Denies urgency, frequency, pain with urination, burning sensation, blood in urine, odor or discharge. Musculoskeletal: Denies decrease in range of motion, difficulty with gait, muscle pain or joint pain and swelling.  Skin: Denies redness, rashes, lesions or ulcercations.  Neurological: Denies dizziness, difficulty with memory, difficulty with speech or problems with balance and coordination.  Psych: Pt has a history of anxiety and depression. Denies SI/HI.  No other specific complaints in a complete review of systems (except as listed in HPI above).  Objective:   Physical Exam  BP 127/78 (BP Location: Right Arm, Patient Position: Sitting, Cuff Size: Normal)   Pulse  76   Temp (!) 97.1 F (36.2 C) (Temporal)   Resp 17   Ht 5\' 4"  (1.626 m)   Wt 142 lb 6.4 oz (64.6 kg)   SpO2 99%   BMI 24.44 kg/m   Wt Readings from Last 3 Encounters:  10/08/18 131 lb 6.4 oz (59.6 kg)  09/19/18 133 lb 12.8 oz (60.7 kg)  06/13/17 138 lb 12.8 oz (63 kg)    General: Appears his stated age, well developed, well nourished in NAD. Skin: Warm, dry and intact. No rashes noted. HEENT: Head: normal shape and size; Eyes: sclera white and EOMs intact;  Neck:  Neck supple, trachea midline. No masses, lumps or thyromegaly present.  Cardiovascular: Normal rate and rhythm. S1,S2 noted.  No murmur, rubs or gallops noted. No JVD or BLE edema.  Pulmonary/Chest: Normal effort and positive vesicular breath sounds. No respiratory distress. No wheezes, rales or ronchi noted.  Abdomen: Soft and nontender. Normal bowel sounds. No distention or masses noted. Liver, spleen and kidneys non palpable. Musculoskeletal: Strength 5/5 BUE/BLE. No difficulty with gait.  Neurological: Alert and oriented. Cranial nerves II-XII grossly intact. Coordination normal.  Psychiatric: Mood and affect normal. Behavior is normal. Judgment and thought content normal.   BMET    Component Value Date/Time   NA 139 09/13/2016 0818   K 4.3 09/13/2016 0818   CL 106 09/13/2016 0818   CO2 23 09/13/2016 0818   GLUCOSE 89 09/13/2016 0818   BUN 13 09/13/2016 0818   CREATININE 1.03 09/13/2016 0818   CALCIUM 9.4 09/13/2016 0818    Lipid Panel     Component Value Date/Time   CHOL 151 09/13/2016 0818   TRIG 37 09/13/2016 0818   HDL 49 09/13/2016 0818   CHOLHDL 3.1 09/13/2016 0818   VLDL 7 09/13/2016 0818   LDLCALC 95 09/13/2016 0818    CBC    Component Value Date/Time   WBC 5.7 09/13/2016 0818   RBC 4.74 09/13/2016 0818   HGB 15.0 09/13/2016 0818   HCT 45.7 09/13/2016 0818   PLT 278 09/13/2016 0818   MCV 96.4 09/13/2016 0818   MCH 31.6 09/13/2016 0818   MCHC 32.8 09/13/2016 0818   RDW 13.3  09/13/2016 0818   LYMPHSABS 1,767 09/13/2016 0818   MONOABS 627 09/13/2016 0818   EOSABS 228 09/13/2016 0818   BASOSABS 0 09/13/2016 0818    Hgb A1C Lab Results  Component Value Date   HGBA1C 5.1 09/13/2016            Assessment & Plan:   Preventative Health Maintenance:  Encouraged him to get a flu shot in the fall Tetanus UTD He declines covid vaccine Encouraged him to consume a balanced  diet and exercise regimen Advised him to see an eye doctor and dentist annually Will check CBC, CMET, TSH, Lipid, A1C and Vit D today Will check HIV, RPR, hep C, urine gonorrhea, chlamydia and trich  RTC in 1 year, sooner if needed   Nicki Reaper, NP This visit occurred during the SARS-CoV-2 public health emergency.  Safety protocols were in place, including screening questions prior to the visit, additional usage of staff PPE, and extensive cleaning of exam room while observing appropriate contact time as indicated for disinfecting solutions.

## 2020-10-13 ENCOUNTER — Encounter: Payer: Self-pay | Admitting: Internal Medicine

## 2020-10-13 DIAGNOSIS — R5383 Other fatigue: Secondary | ICD-10-CM

## 2020-10-13 LAB — CBC
HCT: 44.9 % (ref 38.5–50.0)
Hemoglobin: 15 g/dL (ref 13.2–17.1)
MCH: 31.9 pg (ref 27.0–33.0)
MCHC: 33.4 g/dL (ref 32.0–36.0)
MCV: 95.5 fL (ref 80.0–100.0)
MPV: 11 fL (ref 7.5–12.5)
Platelets: 295 10*3/uL (ref 140–400)
RBC: 4.7 10*6/uL (ref 4.20–5.80)
RDW: 12 % (ref 11.0–15.0)
WBC: 6 10*3/uL (ref 3.8–10.8)

## 2020-10-13 LAB — VITAMIN D 25 HYDROXY (VIT D DEFICIENCY, FRACTURES): Vit D, 25-Hydroxy: 34 ng/mL (ref 30–100)

## 2020-10-13 LAB — COMPLETE METABOLIC PANEL WITH GFR
AG Ratio: 1.7 (calc) (ref 1.0–2.5)
ALT: 19 U/L (ref 9–46)
AST: 21 U/L (ref 10–40)
Albumin: 4.5 g/dL (ref 3.6–5.1)
Alkaline phosphatase (APISO): 102 U/L (ref 36–130)
BUN: 10 mg/dL (ref 7–25)
CO2: 27 mmol/L (ref 20–32)
Calcium: 9.7 mg/dL (ref 8.6–10.3)
Chloride: 104 mmol/L (ref 98–110)
Creat: 0.93 mg/dL (ref 0.60–1.29)
Globulin: 2.7 g/dL (calc) (ref 1.9–3.7)
Glucose, Bld: 87 mg/dL (ref 65–99)
Potassium: 4.2 mmol/L (ref 3.5–5.3)
Sodium: 138 mmol/L (ref 135–146)
Total Bilirubin: 0.5 mg/dL (ref 0.2–1.2)
Total Protein: 7.2 g/dL (ref 6.1–8.1)
eGFR: 104 mL/min/{1.73_m2} (ref >=60)

## 2020-10-13 LAB — LIPID PANEL
Cholesterol: 140 mg/dL (ref <200)
HDL: 54 mg/dL (ref >=40)
LDL Cholesterol (Calc): 73 mg/dL (calc)
Non-HDL Cholesterol (Calc): 86 mg/dL (calc) (ref <130)
Total CHOL/HDL Ratio: 2.6 (calc) (ref <5.0)
Triglycerides: 47 mg/dL (ref <150)

## 2020-10-13 LAB — HEPATITIS C ANTIBODY
Hepatitis C Ab: NONREACTIVE
SIGNAL TO CUT-OFF: 0.01 (ref <1.00)

## 2020-10-13 LAB — HEMOGLOBIN A1C
Hgb A1c MFr Bld: 5.1 % of total Hgb (ref <5.7)
Mean Plasma Glucose: 100 mg/dL
eAG (mmol/L): 5.5 mmol/L

## 2020-10-13 LAB — HIV ANTIBODY (ROUTINE TESTING W REFLEX): HIV 1&2 Ab, 4th Generation: NONREACTIVE

## 2020-10-13 LAB — RPR: RPR Ser Ql: NONREACTIVE

## 2020-10-13 LAB — TSH: TSH: 1.85 mIU/L (ref 0.40–4.50)

## 2020-10-14 LAB — URINE CYTOLOGY ANCILLARY ONLY
Chlamydia: NEGATIVE
Comment: NEGATIVE
Comment: NEGATIVE
Comment: NORMAL
Neisseria Gonorrhea: NEGATIVE
Trichomonas: NEGATIVE

## 2020-10-28 ENCOUNTER — Encounter: Payer: Self-pay | Admitting: General Surgery

## 2020-10-28 ENCOUNTER — Other Ambulatory Visit: Payer: Self-pay

## 2020-10-28 ENCOUNTER — Ambulatory Visit (INDEPENDENT_AMBULATORY_CARE_PROVIDER_SITE_OTHER): Payer: No Typology Code available for payment source | Admitting: General Surgery

## 2020-10-28 VITALS — BP 121/78 | HR 74 | Temp 98.8°F | Ht 64.0 in | Wt 143.8 lb

## 2020-10-28 DIAGNOSIS — K645 Perianal venous thrombosis: Secondary | ICD-10-CM

## 2020-10-28 NOTE — Patient Instructions (Signed)
Call our office if you decide to move forward with having Hemorrhoidecotmy.  Avoid constipation.Drink plenty of water. You may try Metamucil, Benefiber,Citrucel. You may try taking Fiber gummies.   Fiber Content in Foods Fiber is a substance that is found in plant foods, such as fruits, vegetables, whole grains, nuts, seeds, and beans. As part of your treatment and recovery plan, your health care provider may recommend that you eat foods that have specific amounts of dietary fiber. Some conditions may require a high-fiberdiet while others may require a low-fiber diet. This sheet gives you information about the dietary fiber content of some common foods. Your health care provider will tell you how much fiber you need in your diet. If you have problems or questions, contact your health care provider ordietitian. What foods are high in fiber?  Fruits Blackberries or raspberries (fresh) --  cup (75 g) has 4 g of fiber. Pear (fresh) -- 1 medium (180 g) has 5.5 g of fiber. Prunes (dried) -- 6 to 8 pieces (57-76 g) has 5 g of fiber. Apple with skin -- 1 medium (182 g) has 4.8 g of fiber. Guava -- 1 cup (128 g) has 8.9 g of fiber. Vegetables Peas (frozen) --  cup (80 g) has 4.4 g of fiber. Potato with skin (baked) -- 1 medium (173 g) has 4.4 g of fiber. Pumpkin (canned) --  cup (122 g) has 5 g of fiber. Brussels sprouts (cooked) --  cup (78 g) has 4 g of fiber. Sweet potato --  cup mashed (124 g) has 4 g of fiber. Winter squash -- 1 cup cooked (205 g) has 5.7 g of fiber. Grains Bran cereal --  cup (31 g) has 8.6 g of fiber. Bulgur (cooked) --  cup (70 g) has 4 g of fiber. Quinoa (cooked) -- 1 cup (185 g) has 5.2 g of fiber. Popcorn -- 3 cups (375 g) popped has 5.8 g of fiber. Spaghetti, whole wheat -- 1 cup (140 g) has 6 g of fiber. Meats and other proteins Pinto beans (cooked) --  cup (90 g) has 7.7 g of fiber. Lentils (cooked) --  cup (90 g) has 7.8 g of fiber. Kidney beans (canned)  --  cup (92.5 g) has 5.7 g of fiber. Soybeans (canned, frozen, or fresh) --  cup (92.5 g) has 5.2 g of fiber. Baked beans, plain or vegetarian (canned) --  cup (130 g) has 5.2 g of fiber. Garbanzo beans or chickpeas (canned) --  cup (90 g) has 6.6 g of fiber. Black beans (cooked) --  cup (86 g) has 7.5 g of fiber. White beans or navy beans (cooked) --  cup (91 g) has 9.3 g of fiber. The items listed above may not be a complete list of foods with high fiber. Actual amounts of fiber may be different depending on processing. Contact a dietitian for more information. What foods are moderate in fiber?  Fruits Banana -- 1 medium (126 g) has 3.2 g of fiber. Melon -- 1 cup (155 g) has 1.4 g of fiber. Orange -- 1 small (154 g) has 3.7 g of fiber. Raisins --  cup (40 g) has 1.8 g of fiber. Applesauce, sweetened --  cup (125 g) has 1.5 g of fiber. Blueberries (fresh) --  cup (75 g) has 1.8 g of fiber. Strawberries (fresh, sliced) -- 1 cup (150 g) has 3 g of fiber. Cherries -- 1 cup (140 g) has 2.9 g of fiber. Vegetables Broccoli (cooked) --  cup (77.5 g) has 2.1 g of fiber. Carrots (cooked) --  cup (77.5 g) has 2.2 g of fiber. Corn (canned or frozen) --  cup (82.5 g) has 2.1 g of fiber. Potatoes, mashed --  cup (105 g) has 1.6 g of fiber. Tomato -- 1 medium (62 g) has 1.5 g of fiber. Green beans (canned) --  cup (83 g) has 2 g of fiber. Squash, winter --  cup (58 g) has 1 g of fiber. Sweet potato, baked -- 1 medium (150 g) has 3 g of fiber. Cauliflower (cooked) -- 1/2 cup (90 g) has 2.3 g of fiber. Grains Long-grain brown rice (cooked) -- 1 cup (196 g) has 3.5 g of fiber. Bagel, plain -- one 4-inch (10 cm) bagel has 2 g of fiber. Instant oatmeal --  cup (120 g) has about 2 g of fiber. Macaroni noodles, enriched (cooked) -- 1 cup (140 g) has 2.5 g of fiber. Multigrain cereal --  cup (15 g) has about 2-4 g of fiber. Whole-wheat bread -- 1 slice (26 g) has 2 g of  fiber. Whole-wheat spaghetti noodles --  cup (70 g) has 3.2 g of fiber. Corn tortilla -- one 6-inch (15 cm) tortilla has 1.5 g of fiber. Meats and other proteins Almonds --  cup or 1 oz (28 g) has 3.5 g of fiber. Sunflower seeds in shell --  cup or  oz (11.5 g) has 1.1 g of fiber. Vegetable or soy patty -- 1 patty (70 g) has 3.4 g of fiber. Walnuts --  cup or 1 oz (30 g) has 2 g of fiber. Flax seed -- 1 Tbsp (7 g) has 2.8 g of fiber. The items listed above may not be a complete list of foods that have moderate amounts of fiber. Actual amounts of fiber may be different depending on processing. Contact a dietitian for more information. What foods are low in fiber?  Low-fiber foods contain less than 1 g of fiber per serving. They include: Fruits Fruit juice --  cup or 4 fl oz (118 mL) has 0.5 g of fiber. Vegetables Lettuce -- 1 cup (35 g) has 0.5 g of fiber. Cucumber (slices) --  cup (60 g) has 0.3 g of fiber. Celery -- 1 stalk (40 g) has 0.1 g of fiber. Grains Flour tortilla -- one 6-inch (15 cm) tortilla has 0.5 g of fiber. White rice (cooked) --  cup (81.5 g) has 0.3 g of fiber. Meats and other proteins Egg -- 1 large (50 g) has 0 g of fiber. Meat, poultry, or fish -- 3 oz (85 g) has 0 g of fiber. Dairy Milk -- 1 cup or 8 fl oz (237 mL) has 0 g of fiber. Yogurt -- 1 cup (245 g) has 0 g of fiber. The items listed above may not be a complete list of foods that are low in fiber. Actual amounts of fiber may be different depending on processing. Contact a dietitian for more information. Summary Fiber is a substance that is found in plant foods, such as fruits, vegetables, whole grains, nuts, seeds, and beans. As part of your treatment and recovery plan, your health care provider may recommend that you eat foods that have specific amounts of dietary fiber. This information is not intended to replace advice given to you by your health care provider. Make sure you discuss any  questions you have with your healthcare provider. Document Revised: 07/17/2019 Document Reviewed: 07/17/2019 Elsevier Patient Education  2022 [employer].  Constipation, Adult Constipation is when a person has trouble pooping (having a bowel movement). When you have this condition, you may poop fewer than 3 times a week. Your poop (stool) may also be dry, hard, or bigger than normal. Follow these instructions at home: Eating and drinking  Eat foods that have a lot of fiber, such as: Fresh fruits and vegetables. Whole grains. Beans. Eat less of foods that are low in fiber and high in fat and sugar, such as: [place] fries. Hamburgers. Cookies. Candy. Soda. Drink enough fluid to keep your pee (urine) pale yellow.  General instructions Exercise regularly or as told by your doctor. Try to do 150 minutes of exercise each week. Go to the restroom when you feel like you need to poop. Do not hold it in. Take over-the-counter and prescription medicines only as told by your doctor. These include any fiber supplements. When you poop: Do deep breathing while relaxing your lower belly (abdomen). Relax your pelvic floor. The pelvic floor is a group of muscles that support the rectum, bladder, and intestines (as well as the uterus in women). Watch your condition for any changes. Tell your doctor if you notice any. Keep all follow-up visits as told by your doctor. This is important. Contact a doctor if: You have pain that gets worse. You have a fever. You have not pooped for 4 days. You vomit. You are not hungry. You lose weight. You are bleeding from the opening of the butt (anus). You have thin, pencil-like poop. Get help right away if: You have a fever, and your symptoms suddenly get worse. You leak poop or have blood in your poop. Your belly feels hard or bigger than normal (bloated). You have very bad belly pain. You feel dizzy or you faint. Summary Constipation is when a  person poops fewer than 3 times a week, has trouble pooping, or has poop that is dry, hard, or bigger than normal. Eat foods that have a lot of fiber. Drink enough fluid to keep your pee (urine) pale yellow. Take over-the-counter and prescription medicines only as told by your doctor. These include any fiber supplements. This information is not intended to replace advice given to you by your health care provider. Make sure you discuss any questions you have with your healthcare provider. Document Revised: 01/29/2019 Document Reviewed: 01/29/2019 Elsevier Patient Education  2022 Else[employer] Inc. How to Take a [employer] A sitz bath is a warm water bath that may be used to care for your rectum, genital area, or the area between your rectum and genitals (perineum). In a sitz bath, the water only comes up to your hips and covers your buttocks. A sitz bath may be done in a bathtub or with a portable sitz baththat fits over the toilet. Your health care provider may recommend a sitz bath to help: Relieve pain and discomfort after delivering a baby. Relieve pain and itching from hemorrhoids or anal fissures. Relieve pain after certain surgeries. Relax muscles that are sore or tight. How to take a sitz bath Take 3-4 sitz baths a day, or as many as told by your health care provider. Bathtub sitz bath To take a sitz bath in a bathtub: Partially fill a bathtub with warm water. The water should be deep enough to cover your hips and buttocks when you are sitting in the tub. Follow your health care provider's instructions if you are told to put medicine in the water. Sit in the water. Open the tub drain  a little, and leave it open during your bath. Turn on the warm water again, enough to replace the water that is draining out. Keep the water running throughout your bath. This helps keep the water at the right level and temperature. Soak in the water for 15-20 minutes, or as long as told by your health care  provider. When you are done, be careful when you stand up. You may feel dizzy. After the sitz bath, pat yourself dry. Do not rub your skin to dry it.  Over-the-toilet sitz bath To take a sitz bath with an over-the-toilet basin: Follow the manufacturer's instructions. Fill the basin with warm water. Follow your health care provider's instructions if you were told to put medicine in the water. Sit on the seat. Make sure the water covers your buttocks and perineum. Soak in the water for 15-20 minutes, or as long as told by your health care provider. After the sitz bath, pat yourself dry. Do not rub your skin to dry it. Clean and dry the basin between uses. Discard the basin if it cracks, or according to the manufacturer's instructions.  Contact a health care provider if: Your pain or itching gets worse. Do not continue with sitz baths if your symptoms get worse. You have new symptoms. Do not continue with sitz baths until you talk with your health care provider. Summary A sitz bath is a warm water bath in which the water only comes up to your hips and covers your buttocks. A sitz bath may help relieve pain and discomfort after delivering a baby. It also may help with pain and itching from hemorrhoids or anal fissures, or pain after certain surgeries. It can also help to relax muscles that are sore or tight. Take 3-4 sitz baths a day, or as many as told by your health care provider. Soak in the water for 15-20 minutes. Do not continue with sitz baths if your symptoms get worse. This information is not intended to replace advice given to you by your health care provider. Make sure you discuss any questions you have with your healthcare provider. Document Revised: 11/27/2019 Document Reviewed: 11/27/2019 Elsevier Patient Education  2022 Else[employer] Inc[employer] Hemorrhoids Hemorrhoids are swollen veins in and around the rectum or anus. There are two types of hemorrhoids: Internal hemorrhoids. These occur  in the veins that are just inside the rectum. They may poke through to the outside and become irritated and painful. External hemorrhoids. These occur in the veins that are outside the anus and can be felt as a painful swelling or hard lump near the anus. Most hemorrhoids do not cause serious problems, and they can be managed with home treatments such as diet and lifestyle changes. If home treatments do nothelp the symptoms, procedures can be done to shrink or remove the hemorrhoids. What are the causes? This condition is caused by increased pressure in the anal area. This pressure may result from various things, including: Constipation. Straining to have a bowel movement. Diarrhea. Pregnancy. Obesity. Sitting for long periods of time. Heavy lifting or other activity that causes you to strain. Anal sex. Riding a bike for a long period of time. What are the signs or symptoms? Symptoms of this condition include: Pain. Anal itching or irritation. Rectal bleeding. Leakage of stool (feces). Anal swelling. One or more lumps around the anus. How is this diagnosed? This condition can often be diagnosed through a visual exam. Other exams or tests may also be done, such as: An exam  that involves feeling the rectal area with a gloved hand (digital rectal exam). An exam of the anal canal that is done using a small tube (anoscope). A blood test, if you have lost a significant amount of blood. A test to look inside the colon using a flexible tube with a camera on the end (sigmoidoscopy or colonoscopy). How is this treated? This condition can usually be treated at home. However, various procedures may be done if dietary changes, lifestyle changes, and other home treatments do not help your symptoms. These procedures can help make the hemorrhoids smaller or remove them completely. Some of these procedures involve surgery, and others do not. Common procedures include: Rubber band ligation. Rubber bands  are placed at the base of the hemorrhoids to cut off their blood supply. Sclerotherapy. Medicine is injected into the hemorrhoids to shrink them. Infrared coagulation. A type of light energy is used to get rid of the hemorrhoids. Hemorrhoidectomy surgery. The hemorrhoids are surgically removed, and the veins that supply them are tied off. Stapled hemorrhoidopexy surgery. The surgeon staples the base of the hemorrhoid to the rectal wall. Follow these instructions at home: Eating and drinking  Eat foods that have a lot of fiber in them, such as whole grains, beans, nuts, fruits, and vegetables. Ask your health care provider about taking products that have added fiber (fiber supplements). Reduce the amount of fat in your diet. You can do this by eating low-fat dairy products, eating less red meat, and avoiding processed foods. Drink enough fluid to keep your urine pale yellow.  Managing pain and swelling  Take warm sitz baths for 20 minutes, 3-4 times a day to ease pain and discomfort. You may do this in a bathtub or using a portable sitz bath that fits over the toilet. If directed, apply ice to the affected area. Using ice packs between sitz baths may be helpful. Put ice in a plastic bag. Place a towel between your skin and the bag. Leave the ice on for 20 minutes, 2-3 times a day.  General instructions Take over-the-counter and prescription medicines only as told by your health care provider. Use medicated creams or suppositories as told. Get regular exercise. Ask your health care provider how much and what kind of exercise is best for you. In general, you should do moderate exercise for at least 30 minutes on most days of the week (150 minutes each week). This can include activities such as walking, biking, or yoga. Go to the bathroom when you have the urge to have a bowel movement. Do not wait. Avoid straining to have bowel movements. Keep the anal area dry and clean. Use wet toilet paper  or moist towelettes after a bowel movement. Do not sit on the toilet for long periods of time. This increases blood pooling and pain. Keep all follow-up visits as told by your health care provider. This is important. Contact a health care provider if you have: Increasing pain and swelling that are not controlled by treatment or medicine. Difficulty having a bowel movement, or you are unable to have a bowel movement. Pain or inflammation outside the area of the hemorrhoids. Get help right away if you have: Uncontrolled bleeding from your rectum. Summary Hemorrhoids are swollen veins in and around the rectum or anus. Most hemorrhoids can be managed with home treatments such as diet and lifestyle changes. Taking warm sitz baths can help ease pain and discomfort. In severe cases, procedures or surgery can be done to shrink  or remove the hemorrhoids. This information is not intended to replace advice given to you by your health care provider. Make sure you discuss any questions you have with your healthcare provider. Document Revised: 08/09/2018 Document Reviewed: 08/02/2017 Elsevier Patient Education  2022 [employer].

## 2020-10-28 NOTE — Progress Notes (Signed)
Patient ID: Justin Short, male   DOB: 04/30/1975, 44 y.o.   MRN: [id number]  Chief Complaint  Patient presents with   Hemorrhoids   Follow-up    hemorrhoids    HPI Justin Short is a 45 y.o. male.   He has been seen in our clinic on 2 prior occasions, in 2019 by Dr. Earlene Plater, and in 2020 by Dr. Aleen Campi.  Both times were for painful hemorrhoids.  When he saw Dr. Earlene Plater, he was diagnosed with a thrombosed external hemorrhoid, but enough time it passed that evacuation was considered to likely not be beneficial.  When he saw Dr. Aleen Campi about 2 years ago, he had had similar pain, but again, had not been seen in an expeditious fashion and was starting to improve using topical lidocaine and corticosteroid.  2 days ago, he had acute onset of exquisite perirectal pain.  He says that it has become more painful and he feels like he is sitting on a golf ball.  He only notices a small amount of blood on his toilet tissue; it does not stain the toilet water and does not occur with every bowel movement.  He does report that he has been taking magnesium and as result, has had significant diarrhea.  He stopped taking the magnesium about 2 days ago, but his bowels have not yet returned to normal.  He denies any long history of constipation.  I do not see a colonoscopy in his electronic medical record.  He has a history of prior lateral internal sphincterotomy for anal fissure and reports that at that time, the surgeon told him that he also performed a hemorrhoidectomy.   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 cancer   Thyroid disease Paternal Aunt     Social History Social History   Tobacco Use   Smoking status: Former    Types: Cigarettes, Cigars    Start date: 03/28/2020   Smokeless tobacco: Never  Vaping Use   Vaping Use: Some days  Substance Use Topics   Alcohol use: Yes   Drug use: Yes    Frequency: 10.0 times per week    Types: Marijuana    Comment: smokes small amount each occurrence 2-3 g / week    No Known Allergies  Current Outpatient Medications  Medication Sig Dispense Refill   acetaminophen (TYLENOL) 325 MG tablet Take 650 mg by mouth every 6 (six) hours as needed.     hydrocortisone (ANUSOL-HC) 25 MG suppository Place 25 mg rectally 2 (two) times daily.     lidocaine (XYLOCAINE) 2 % jelly 1 application as needed.     No current facility-administered medications for this visit.    Review of Systems Review of Systems  Gastrointestinal:  Positive for constipation,  nausea and rectal pain.  Psychiatric/Behavioral:  Positive for dysphoric mood.   All other systems reviewed and are negative.  Blood pressure 121/78, pulse 74, temperature 98.8 F (37.1 C), temperature source Oral, height 5\' 4"  (1.626 m), weight 143 lb 12.8 oz (65.2 kg), SpO2 95 %. Body mass index is 24.68 kg/m.  Physical Exam Physical Exam Vitals reviewed. Exam conducted with a chaperone present.  Constitutional:      Appearance: He is normal weight.     Comments: He is sitting in such a manner as to off-weight his perineum.  HENT:     Head: Normocephalic and atraumatic.     Nose:     Comments: Covered with a mask    Mouth/Throat:     Comments: Covered with a mask Eyes:     General: No scleral icterus.       Right eye: No discharge.        Left eye: No discharge.  Neck:     Comments: No palpable cervical or supraclavicular lymphadenopathy.  The trachea is midline.  No thyromegaly or dominant thyroid  masses appreciated.  The gland moves freely with deglutition. Cardiovascular:     Rate and Rhythm: Normal rate and regular rhythm.     Pulses: Normal pulses.  Pulmonary:     Effort: Pulmonary effort is normal.     Breath sounds: Normal breath sounds.  Abdominal:     General: Abdomen is flat. Bowel sounds are normal.     Palpations: Abdomen is soft.  Genitourinary:    Rectum: External hemorrhoid present.       Comments: Thrombosed external hemorrhoid present Musculoskeletal:        General: No swelling or tenderness.  Skin:    General: Skin is warm.  Neurological:     General: No focal deficit present.     Mental Status: He is alert and oriented to person, place, and time.  Psychiatric:        Mood and Affect: Mood normal.        Behavior: Behavior normal.    Data Reviewed I reviewed the clinic notes from his prior visits to our group, including the March 2019 visit with Dr. April 2019 and the July 2020 visit with Dr. August 2020.  Those findings are summarized in the history of present illness.  Assessment This is a 44 year old man with a prior history notable for at least 2 episodes of thrombosed hemorrhoids.  He presents today with an acutely thrombosed external hemorrhoid.  Plan I have offered him bedside evacuation of the thrombosis.  I discussed the risks of the procedure which include, but are not limited to, bleeding, infection, failure to relieve symptoms, and recurrence.  He wished to proceed.  Incision Thrombosed Hemorrhoid Procedure Note  Pre-operative Diagnosis: Thrombosed external hemorrhoid  Post-operative Diagnosis: Thrombosed external hemorrhoid  Locations:left lateral column  Indications: Painful external hemorrhoid. Explained surgical vs conservative treatment; surgical incision and drainage of clot usually works quickly to reduce pain and shorten healing time, but is uncomfortable to perform.  After discussion, the patient wishes to proceed with this  procedure.  Anesthesia: Lidocaine 1% with epinephrine without added sodium bicarbonate  Procedure Details  After adequate anesthesia, an elliptical incision was made, and the visible clot was extruded with curved hemostat. This was well tolerated. Bulky dressing is applied.  Complications: none.  Plan: 1. Very frequent Sitz baths. 2. Colace tid prn and increase fluids to prevent constipation. Fiber supplements to maintain regularity  3. Call or return  to clinic prn if these symptoms worsen or fail to improve as anticipated. 4. If surgical hemorrhoidectomy is desired, he will contact our office.    Duanne Guess 10/28/2020, 3:29 PM

## 2020-12-24 ENCOUNTER — Encounter: Payer: Self-pay | Admitting: General Surgery

## 2021-02-01 ENCOUNTER — Other Ambulatory Visit: Payer: Self-pay

## 2021-02-01 ENCOUNTER — Ambulatory Visit (INDEPENDENT_AMBULATORY_CARE_PROVIDER_SITE_OTHER): Payer: No Typology Code available for payment source | Admitting: Surgery

## 2021-02-01 ENCOUNTER — Encounter: Payer: Self-pay | Admitting: Surgery

## 2021-02-01 VITALS — BP 122/77 | HR 82 | Temp 98.5°F | Ht 64.0 in | Wt 139.6 lb

## 2021-02-01 DIAGNOSIS — K645 Perianal venous thrombosis: Secondary | ICD-10-CM | POA: Diagnosis not present

## 2021-02-01 NOTE — Patient Instructions (Addendum)
Please call our office if you have any questions or concerns.    How to Take a [employer] A sitz bath is a warm water bath that may be used to care for your rectum, genital area, or the area between your rectum and genitals (perineum). In a sitz bath, the water only comes up to your hips and covers your buttocks. A sitz bath may be done in a bathtub or with a portable sitz bath that fits over the toilet. Your health care provider may recommend a sitz bath to help: Relieve pain and discomfort after delivering a baby. Relieve pain and itching from hemorrhoids or anal fissures. Relieve pain after certain surgeries. Relax muscles that are sore or tight. How to take a sitz bath Take 3-4 sitz baths a day, or as many as told by your health care provider. Bathtub sitz bath To take a sitz bath in a bathtub: Partially fill a bathtub with warm water. The water should be deep enough to cover your hips and buttocks when you are sitting in the tub. Follow your health care provider's instructions if you are told to put medicine in the water. Sit in the water. Open the tub drain a little, and leave it open during your bath. Turn on the warm water again, enough to replace the water that is draining out. Keep the water running throughout your bath. This helps keep the water at the right level and temperature. Soak in the water for 15-20 minutes, or as long as told by your health care provider. When you are done, be careful when you stand up. You may feel dizzy. After the sitz bath, pat yourself dry. Do not rub your skin to dry it.  Over-the-toilet sitz bath To take a sitz bath with an over-the-toilet basin: Follow the manufacturer's instructions. Fill the basin with warm water. Follow your health care provider's instructions if you were told to put medicine in the water. Sit on the seat. Make sure the water covers your buttocks and perineum. Soak in the water for 15-20 minutes, or as long as told by your  health care provider. After the sitz bath, pat yourself dry. Do not rub your skin to dry it. Clean and dry the basin between uses. Discard the basin if it cracks, or according to the manufacturer's instructions.  Contact a health care provider if: Your pain or itching gets worse. Do not continue with sitz baths if your symptoms get worse. You have new symptoms. Do not continue with sitz baths until you talk with your health care provider. Summary A sitz bath is a warm water bath in which the water only comes up to your hips and covers your buttocks. A sitz bath may help relieve pain and discomfort after delivering a baby. It also may help with pain and itching from hemorrhoids or anal fissures, or pain after certain surgeries. It can also help to relax muscles that are sore or tight. Take 3-4 sitz baths a day, or as many as told by your health care provider. Soak in the water for 15-20 minutes. Do not continue with sitz baths if your symptoms get worse. This information is not intended to replace advice given to you by your health care provider. Make sure you discuss any questions you have with your health care provider. Document Revised: 11/27/2019 Document Reviewed: 11/27/2019 Elsevier Patient Education  2022 [employer] Inc[employer]     Advised to pursue a goal of 25 to 30 g of fiber daily.  Made aware that the majority of this may be through natural sources, but advised to be aware of actual consumption and to ensure minimal consumption by daily supplementation.  Various forms of supplements discussed.  Strongly advised to consume more fluids to ensure adequate hydration, instructed to watch color of urine to determine adequacy of hydration.  Clarity is pursued in urine output, and bowel activity that correlates to significant meal intake.  Patient is to avoid deferring having bowel movements, advised to take the time at the first sign of sensation, typically following meals and in the morning.   Subsequent utilization of MiraLAX to ensure at least daily movement, ideally twice daily bowel movements.  If multiple doses of MiraLAX are necessary utilize them.      Hemorrhoids Hemorrhoids are swollen veins in and around the rectum or anus. There are two types of hemorrhoids: Internal hemorrhoids. These occur in the veins that are just inside the rectum. They may poke through to the outside and become irritated and painful. External hemorrhoids. These occur in the veins that are outside the anus and can be felt as a painful swelling or hard lump near the anus. Most hemorrhoids do not cause serious problems, and they can be managed with home treatments such as diet and lifestyle changes. If home treatments do not help the symptoms, procedures can be done to shrink or remove the hemorrhoids. What are the causes? This condition is caused by increased pressure in the anal area. This pressure may result from various things, including: Constipation. Straining to have a bowel movement. Diarrhea. Pregnancy. Obesity. Sitting for long periods of time. Heavy lifting or other activity that causes you to strain. Anal sex. Riding a bike for a long period of time. What are the signs or symptoms? Symptoms of this condition include: Pain. Anal itching or irritation. Rectal bleeding. Leakage of stool (feces). Anal swelling. One or more lumps around the anus. How is this diagnosed? This condition can often be diagnosed through a visual exam. Other exams or tests may also be done, such as: An exam that involves feeling the rectal area with a gloved hand (digital rectal exam). An exam of the anal canal that is done using a small tube (anoscope). A blood test, if you have lost a significant amount of blood. A test to look inside the colon using a flexible tube with a camera on the end (sigmoidoscopy or colonoscopy). How is this treated? This condition can usually be treated at home. However,  various procedures may be done if dietary changes, lifestyle changes, and other home treatments do not help your symptoms. These procedures can help make the hemorrhoids smaller or remove them completely. Some of these procedures involve surgery, and others do not. Common procedures include: Rubber band ligation. Rubber bands are placed at the base of the hemorrhoids to cut off their blood supply. Sclerotherapy. Medicine is injected into the hemorrhoids to shrink them. Infrared coagulation. A type of light energy is used to get rid of the hemorrhoids. Hemorrhoidectomy surgery. The hemorrhoids are surgically removed, and the veins that supply them are tied off. Stapled hemorrhoidopexy surgery. The surgeon staples the base of the hemorrhoid to the rectal wall. Follow these instructions at home: Eating and drinking  Eat foods that have a lot of fiber in them, such as whole grains, beans, nuts, fruits, and vegetables. Ask your health care provider about taking products that have added fiber (fiber supplements). Reduce the amount of fat in your diet. You can  do this by eating low-fat dairy products, eating less red meat, and avoiding processed foods. Drink enough fluid to keep your urine pale yellow. Managing pain and swelling  Take warm sitz baths for 20 minutes, 3-4 times a day to ease pain and discomfort. You may do this in a bathtub or using a portable sitz bath that fits over the toilet. If directed, apply ice to the affected area. Using ice packs between sitz baths may be helpful. Put ice in a plastic bag. Place a towel between your skin and the bag. Leave the ice on for 20 minutes, 2-3 times a day. General instructions Take over-the-counter and prescription medicines only as told by your health care provider. Use medicated creams or suppositories as told. Get regular exercise. Ask your health care provider how much and what kind of exercise is best for you. In general, you should do moderate  exercise for at least 30 minutes on most days of the week (150 minutes each week). This can include activities such as walking, biking, or yoga. Go to the bathroom when you have the urge to have a bowel movement. Do not wait. Avoid straining to have bowel movements. Keep the anal area dry and clean. Use wet toilet paper or moist towelettes after a bowel movement. Do not sit on the toilet for long periods of time. This increases blood pooling and pain. Keep all follow-up visits as told by your health care provider. This is important. Contact a health care provider if you have: Increasing pain and swelling that are not controlled by treatment or medicine. Difficulty having a bowel movement, or you are unable to have a bowel movement. Pain or inflammation outside the area of the hemorrhoids. Get help right away if you have: Uncontrolled bleeding from your rectum. Summary Hemorrhoids are swollen veins in and around the rectum or anus. Most hemorrhoids can be managed with home treatments such as diet and lifestyle changes. Taking warm sitz baths can help ease pain and discomfort. In severe cases, procedures or surgery can be done to shrink or remove the hemorrhoids. This information is not intended to replace advice given to you by your health care provider. Make sure you discuss any questions you have with your health care provider. Document Revised: 09/22/2020 Document Reviewed: 09/22/2020 Elsevier Patient Education  2022 [employer].

## 2021-02-01 NOTE — Progress Notes (Signed)
Patient ID: HARM JOU, male   DOB: 08-Dec-1975, 45 y.o.   MRN: [id number]  Chief Complaint: Thrombosed hemorrhoid  History of Present Illness Justin Short is a 45 y.o. male with a prior history of hemorrhoidectomy, and enucleation of thrombosed hemorrhoid.  He reports this hemorrhoid pain flared up over the weekend, saw it and thought it was discolored and hard like it a BB.  He had had history of similar a few months ago.  Reports no change in bowel movements having regular daily movements, typically only once a day.  No prior initiation of supplemental fiber intake.  Denies straining or pushing with bowel activity.  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     hydrocortisone (ANUSOL-HC) 25 MG suppository Place 25 mg rectally 2 (two) times daily.     lidocaine (XYLOCAINE) 2 % jelly 1 application as needed.     No current facility-administered medications for this visit.    Family History Family History  Problem Relation Age of Onset   Cancer Mother        skin   Diabetes Father    Heart disease Father        CHF   Cancer Father        renal   Stroke Father    Kidney disease Father        dialysis s/p kidney surgery ? cancer   Thyroid disease Paternal Aunt       Social History Social History   Tobacco Use   Smoking status: Former    Types: Cigarettes, Cigars    Start date: 03/28/2020   Smokeless tobacco: Never  Vaping Use   Vaping Use: Some days  Substance Use Topics   Alcohol use: Yes   Drug use: Yes     Frequency: 10.0 times per week    Types: Marijuana    Comment: smokes small amount each occurrence 2-3 g / week        Review of Systems  All other systems reviewed and are negative.    Physical Exam Blood pressure 122/77, pulse 82, temperature 98.5 F (36.9 C), temperature source Oral, height 5\' 4"  (1.626 m), weight 139 lb 9.6 oz (63.3 kg), SpO2 97 %. Last Weight  Most recent update: 02/01/2021  9:47 AM    Weight  63.3 kg (139 lb 9.6 oz)             CONSTITUTIONAL: Well developed, and nourished, appropriately responsive and aware without distress.   EYES: Sclera non-icteric.   EARS, NOSE, MOUTH AND THROAT: Mask worn.  Hearing is intact to voice.  NECK: Trachea is midline, and there is no jugular venous distension.  LYMPH NODES:  Lymph nodes in the neck are not enlarged. RESPIRATORY:  Lungs are clear, and breath sounds are equal bilaterally. Normal respiratory effort without pathologic use of accessory muscles. CARDIOVASCULAR: Heart is regular in rate and rhythm. GI: The abdomen is soft, nontender, and nondistended. There were no palpable  masses. GU: Velna HatchetSheila is present.  Digital anal exam is somewhat limited, externally I find no evidence of any external hemorrhoids, no evidence of any thrombosis, sentinel tag etc.  Upon further palpation/digital rectal exam of the anal canal there was hypertrophic resistance to digital penetration, without appreciable evidence of any abnormality.  Patient expressed a desire to demonstrate what he had seen over the weekend, and wanted to express packed pressure as though having a bowel movement under observation.  He did so and I saw nothing unusual about his anal sphincteric complex, no evidence of significant hemorrhoidal disease or thrombotic lesion. MUSCULOSKELETAL:  Symmetrical muscle tone appreciated in all four extremities.    SKIN: Skin turgor is normal. No pathologic skin lesions appreciated.  NEUROLOGIC:  Motor and sensation appear grossly  normal.  Cranial nerves are grossly without defect. PSYCH:  Alert and oriented to person, place and time. Affect is appropriate for situation.  Data Reviewed I have personally reviewed what is currently available of the patient's imaging, recent labs and medical records.   Labs:  CBC Latest Ref Rng & Units 10/12/2020 09/13/2016  WBC 3.8 - 10.8 Thousand/uL 6.0 5.7  Hemoglobin 13.2 - 17.1 g/dL 46.915.0 62.915.0  Hematocrit 52.838.5 - 50.0 % 44.9 45.7  Platelets 140 - 400 Thousand/uL 295 278   CMP Latest Ref Rng & Units 10/12/2020 09/13/2016  Glucose 65 - 99 mg/dL 87 89  BUN 7 - 25 mg/dL 10 13  Creatinine 4.130.60 - 1.29 mg/dL 2.440.93 0.101.03  Sodium 272135 - 146 mmol/L 138 139  Potassium 3.5 - 5.3 mmol/L 4.2 4.3  Chloride 98 - 110 mmol/L 104 106  CO2 20 - 32 mmol/L 27 23  Calcium 8.6 - 10.3 mg/dL 9.7 9.4  Total Protein 6.1 - 8.1 g/dL 7.2 6.7  Total Bilirubin 0.2 - 1.2 mg/dL 0.5 0.4  Alkaline Phos 40 - 115 U/L - 95  AST 10 - 40 U/L 21 16  ALT 9 - 46 U/L 19 17      Imaging:  Within last 24 hrs: No results found.  Assessment    History of anal surgery and hemorrhoidectomy. Patient Active Problem List   Diagnosis Date Noted   Anxiety and depression 10/12/2020    Plan    I reviewed with him the need to get on fiber supplementation considering his past history.  Advised to pursue a goal of 25 to 30 g of fiber daily.  Made aware that the majority of this may be through natural sources, but advised to be aware of actual consumption and to ensure minimal consumption by daily supplementation.  Various forms of supplements discussed.  Strongly advised to consume more fluids to ensure adequate hydration, instructed to watch color of urine to determine adequacy of hydration.  Clarity is pursued in urine output, and bowel activity that correlates to significant meal intake.  Patient is to avoid deferring having bowel movements, advised to take the time at the first sign of sensation, typically following meals and  in the morning.  Subsequent utilization of MiraLAX to ensure at least daily movement, ideally twice daily bowel movements.  If multiple doses of MiraLAX are necessary utilize them.  We did discuss the fact that he will always have some hemorrhoidal/venous tissue presence in this area.  And he should develop better bowel habits in terms of more frequent smaller movements.  I could not identify any way in which I might be able to help him surgically today, and reassured him that some postoperative changes  will be present in this region that he needs to learn how to work with/live with on a regular basis.  Face-to-face time spent with the patient and accompanying care providers(if present) was 30 minutes, with more than 50% of the time spent counseling, educating, and coordinating care of the patient.    These notes generated with voice recognition software. I apologize for typographical errors.  Ronny Bacon M.D., FACS 02/01/2021, 12:06 PM

## 2021-03-16 ENCOUNTER — Encounter: Payer: Self-pay | Admitting: Surgery

## 2021-03-16 ENCOUNTER — Ambulatory Visit: Payer: No Typology Code available for payment source | Admitting: Surgery

## 2021-03-16 ENCOUNTER — Other Ambulatory Visit: Payer: Self-pay

## 2021-03-16 VITALS — BP 120/79 | HR 73 | Temp 98.7°F | Ht 64.0 in | Wt 144.6 lb

## 2021-03-16 DIAGNOSIS — K644 Residual hemorrhoidal skin tags: Secondary | ICD-10-CM

## 2021-03-16 DIAGNOSIS — K648 Other hemorrhoids: Secondary | ICD-10-CM | POA: Diagnosis not present

## 2021-03-16 NOTE — Progress Notes (Signed)
03/16/2021  History of Present Illness: Justin Short is a 45 y.o. male presenting for follow-up of external hemorrhoids.  The patient has been seen in our office multiple times and he is actually undergone I&D of a thrombosed hemorrhoid with Dr. Lady Gary in the past.  He presents today for follow-up as he continues having issues with hemorrhoids and wants to discuss surgery.  The patient reports that he has pain after bowel movement particularly also when wiping and feels that there is enlarged tissue at the outside of the anal opening.  Denies any bleeding with bowel movements but sometimes sees a little bit of blood at the toilet paper.  Denies any pain as the stool is moving through the anal canal.  Reports that he has been doing fiber supplementation and that has actually worked better with keeping his stool softer and having bowel movements twice a day as instructed by Dr. Claudine Mouton on his last visit, but he does notice that if he misses 1 day of fiber, the stool gets hard again.  Denies any fevers, chills, chest pain, shortness of breath.  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 Authorizing Provider  acetaminophen (TYLENOL) 325 MG tablet Take 650 mg by mouth every 6 (six) hours as needed.   Yes [provider]  METAMUCIL FIBER PO Take by mouth.   Yes [provider]    Allergies: No Known Allergies  Review of Systems: Review of Systems  Constitutional:  Negative for chills and fever.  HENT:  Negative for hearing loss.   Respiratory:  Negative for shortness of breath.    Cardiovascular:  Negative for chest pain.  Gastrointestinal:  Positive for constipation. Negative for abdominal pain, nausea and vomiting.  Genitourinary:  Negative for dysuria.  Musculoskeletal:  Negative for myalgias.  Skin:  Negative for rash.  Neurological:  Negative for dizziness.  Psychiatric/Behavioral:  Negative for depression.    Physical Exam BP 120/79    Pulse 73    Temp 98.7 F (37.1 C)    Ht 5\' 4"  (1.626 m)    Wt 144 lb 9.6 oz (65.6 kg)    SpO2 98%    BMI 24.82 kg/m  CONSTITUTIONAL: No acute distress, well-nourished HEENT:  Normocephalic, atraumatic, extraocular motion intact. NECK: Trachea is midline, no jugular venous distention. RESPIRATORY:  Lungs are clear, and breath sounds are equal bilaterally. Normal respiratory effort without pathologic use of accessory muscles. CARDIOVASCULAR: Heart is regular without murmurs, gallops, or rubs. RECTAL: External exam reveals enlarged external hemorrhoid in the left lateral and right mid position.  Both tissues are soft although this is some mild discomfort to palpation.  There is no evidence of thrombosis.  There is a very minimal anterior enlarged tissue.  On digital rectal exam, he does have an increased sphincter tone which the patient reports he feels very tense at the moment and there is a mildly enlarged palpable internal left lateral hemorrhoid. NEUROLOGIC:  Motor and sensation is grossly normal.  Cranial nerves are grossly intact. PSYCH:  Alert and oriented to person,  place and time. Affect is normal.  Labs/Imaging: Labs from 10/12/20: Na 138, K 4.2, Cl 104, CO2 27, BUN 10, Cr 0.93.  LFTs within normal.  WBC 6.0, Hgb 15, Hct 44.9, Plt 295.  Assessment and Plan: This is a 45 y.o. male with internal and external hemorrhoids.  -Discussed with the patient on exam there is no evidence of any acute thrombosis or complications from his hemorrhoid tissue.  The patient does report that he continues to have symptoms of discomfort  after bowel movements due to hemorrhoid tissue and feels enlarged tissue.  He would like to discuss surgery in the form of hemorrhoidectomy.  Discussed with patient that this can be done but discussed also with him that there is significant discomfort that can happen as a result of the surgery due to the pain nerve receptors that are located in the area.  He is willing to proceed.  Reviewed with him the surgery at length which would be an exam under anesthesia with hemorrhoidectomy and discussed with him the risks of bleeding, infection, injury to surrounding structures, that this an outpatient procedure, postoperative pain control, activity restrictions, and he is willing to proceed. - We will schedule him for 04/11/2021.  I spent 40 minutes dedicated to the care of this patient on the date of this encounter to include pre-visit review of records, face-to-face time with the patient discussing diagnosis and management, and any post-visit coordination of care.   Howie Ill, MD [facility]

## 2021-03-16 NOTE — H&P (View-Only) (Signed)
03/16/2021  History of Present Illness: Justin Short is a 45 y.o. male presenting for follow-up of external hemorrhoids.  The patient has been seen in our office multiple times and he is actually undergone I&D of a thrombosed hemorrhoid with Dr. Lady Gary in the past.  He presents today for follow-up as he continues having issues with hemorrhoids and wants to discuss surgery.  The patient reports that he has pain after bowel movement particularly also when wiping and feels that there is enlarged tissue at the outside of the anal opening.  Denies any bleeding with bowel movements but sometimes sees a little bit of blood at the toilet paper.  Denies any pain as the stool is moving through the anal canal.  Reports that he has been doing fiber supplementation and that has actually worked better with keeping his stool softer and having bowel movements twice a day as instructed by Dr. Claudine Mouton on his last visit, but he does notice that if he misses 1 day of fiber, the stool gets hard again.  Denies any fevers, chills, chest pain, shortness of breath.  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 Authorizing Provider  acetaminophen (TYLENOL) 325 MG tablet Take 650 mg by mouth every 6 (six) hours as needed.   Yes [provider]  METAMUCIL FIBER PO Take by mouth.   Yes [provider]    Allergies: No Known Allergies  Review of Systems: Review of Systems  Constitutional:  Negative for chills and fever.  HENT:  Negative for hearing loss.   Respiratory:  Negative for shortness of breath.    Cardiovascular:  Negative for chest pain.  Gastrointestinal:  Positive for constipation. Negative for abdominal pain, nausea and vomiting.  Genitourinary:  Negative for dysuria.  Musculoskeletal:  Negative for myalgias.  Skin:  Negative for rash.  Neurological:  Negative for dizziness.  Psychiatric/Behavioral:  Negative for depression.    Physical Exam BP 120/79    Pulse 73    Temp 98.7 F (37.1 C)    Ht 5\' 4"  (1.626 m)    Wt 144 lb 9.6 oz (65.6 kg)    SpO2 98%    BMI 24.82 kg/m  CONSTITUTIONAL: No acute distress, well-nourished HEENT:  Normocephalic, atraumatic, extraocular motion intact. NECK: Trachea is midline, no jugular venous distention. RESPIRATORY:  Lungs are clear, and breath sounds are equal bilaterally. Normal respiratory effort without pathologic use of accessory muscles. CARDIOVASCULAR: Heart is regular without murmurs, gallops, or rubs. RECTAL: External exam reveals enlarged external hemorrhoid in the left lateral and right mid position.  Both tissues are soft although this is some mild discomfort to palpation.  There is no evidence of thrombosis.  There is a very minimal anterior enlarged tissue.  On digital rectal exam, he does have an increased sphincter tone which the patient reports he feels very tense at the moment and there is a mildly enlarged palpable internal left lateral hemorrhoid. NEUROLOGIC:  Motor and sensation is grossly normal.  Cranial nerves are grossly intact. PSYCH:  Alert and oriented to person,  place and time. Affect is normal.  Labs/Imaging: Labs from 10/12/20: Na 138, K 4.2, Cl 104, CO2 27, BUN 10, Cr 0.93.  LFTs within normal.  WBC 6.0, Hgb 15, Hct 44.9, Plt 295.  Assessment and Plan: This is a 45 y.o. male with internal and external hemorrhoids.  -Discussed with the patient on exam there is no evidence of any acute thrombosis or complications from his hemorrhoid tissue.  The patient does report that he continues to have symptoms of discomfort  after bowel movements due to hemorrhoid tissue and feels enlarged tissue.  He would like to discuss surgery in the form of hemorrhoidectomy.  Discussed with patient that this can be done but discussed also with him that there is significant discomfort that can happen as a result of the surgery due to the pain nerve receptors that are located in the area.  He is willing to proceed.  Reviewed with him the surgery at length which would be an exam under anesthesia with hemorrhoidectomy and discussed with him the risks of bleeding, infection, injury to surrounding structures, that this an outpatient procedure, postoperative pain control, activity restrictions, and he is willing to proceed. - We will schedule him for 04/11/2021.  I spent 40 minutes dedicated to the care of this patient on the date of this encounter to include pre-visit review of records, face-to-face time with the patient discussing diagnosis and management, and any post-visit coordination of care.   Howie Ill, MD [facility]

## 2021-03-16 NOTE — Patient Instructions (Addendum)
Continue to use the Proctosol more on the outside hemorrhoid tissue.   You have requested to have Hemorrhoid surgery today. This will be done at [facility] with Dr Aleen Campi. Please review the information below and your [facility] Information.  You will be required to do 2 enemas prior to your surgery. The first will be the night prior and the second will be done the morning of surgery.  Constipation is going to be your biggest obstacle following surgery. Use all stool softeners, fiber supplements, and laxatives as prescribed after your surgery and be sure to drink 72 ounces of water or more every day. This will help to avoid constipation. If you do all of this and you are still having difficulty, please call our office for further instructions as soon as you begin to have difficulty with bowel movements.  You may want to buy a disposable Sitz bath prior to surgery to aid in pain and cleanliness after surgery. The information on how to do use a disposable sitz bath or using a bath tub are below.   Hemorrhoid Surgery After Care Refer to this sheet in the next few weeks. These instructions provide you with information about caring for yourself after your procedure. Your health care provider may also give you more specific instructions. Your treatment has been planned according to current medical practices, but problems sometimes occur. Call your health care provider if you have any problems or questions after your procedure. What can I expect after the procedure? After the procedure, it is common to have: Rectal pain. Pain when you are having a bowel movement. Slight rectal bleeding.  Follow these instructions at home: Medicines Take over-the-counter and prescription medicines only as told by your health care provider. Do not drive or operate heavy machinery while taking prescription pain medicine. Use a stool softener or a bulk laxative as told by your health care provider. Activity Rest  at home. Return to your normal activities as told by your health care provider. Do not lift anything that is heavier than 10 lb (4.5 kg). Do not sit for long periods of time. Take a walk every day or as told by your health care provider. Do not strain to have a bowel movement. Do not spend a long time sitting on the toilet. Eating and drinking Eat foods that contain fiber, such as whole grains, beans, nuts, fruits, and vegetables. Drink enough fluid to keep your urine clear or pale yellow. General instructions Sit in a warm bath 2-3 times per day to relieve soreness or itching. Keep all follow-up visits as told by your health care provider. This is important. Contact a health care provider if: Your pain medicine is not helping. You have a fever or chills. You become constipated. You have trouble passing urine. Get help right away if: You have very bad rectal pain. You have heavy bleeding from your rectum. This information is not intended to replace advice given to you by your health care provider. Make sure you discuss any questions you have with your health care provider. Document Released: 06/03/2003 Document Revised: 08/19/2015 Document Reviewed: 06/08/2014 Elsevier Interactive Patient Education  2018 [employer].   Disposable Sitz Bath A disposable sitz bath is a plastic basin that fits over the toilet. A bag is hung above the toilet, and the bag is connected to a tube that opens into the basin. The bag is filled with warm water that flows into the basin through the tube. A sitz bath can be used  to help relieve symptoms, clean, and promote healing in the genital and anal areas, as well as in the lower abdomen and buttocks. What are the risks? Sitz baths are generally very safe. It is possible for the skin between the genitals and the anus (perineum) to become infected, but this is rare. You can avoid this by cleaning your sitz bath supplies thoroughly. How to use a disposable sitz  bath Close the clamp on the tube. Make sure the clamp is closed tightly to prevent leakage. Fill the sitz bath basin and the plastic bag with warm water. The water should be warm enough to be comfortable, but not hot. Raise the toilet seat and place the filled basin on the toilet. Make sure the overflow opening is facing toward the back of the toilet. If you prefer, you may place the empty basin on the toilet first, and then use the plastic bag to fill the basin with warm water. Hang the filled plastic bag overhead on a hook or towel rack close to the toilet. The bag should be higher than the toilet so that the water will flow down through the tube. Attach the tube to the opening on the basin. Make sure that the tube is attached to the basin tightly to prevent leakage. Sit on the basin and release the clamp. This will allow warm water to flow into the basin and flush the area around your genitals and anus. Remain sitting on the basin for about 15-20 minutes, or as long as told by your health care provider. Stand up and gently pat your skin dry. If directed, apply clean bandages (dressings) to the affected area as told by your health care provider. Carefully remove the basin from the toilet seat and tip the basin into the toilet to empty any remaining water. Empty any remaining water from the plastic bag into the toilet. Then, flush the toilet. Wash the basin with warm water and soap. Let the basin air dry in the sink. You should also let the plastic bag and the tubing air dry. Store the basin, tubing, and plastic bag in a clean, dry area. Wash your hands with soap and water. If soap and water are not available, use hand sanitizer. Contact a health care provider if: You have symptoms that get worse instead of better. You develop new skin irritation, redness, or swelling around your genitals or anus. This information is not intended to replace advice given to you by your health care provider. Make  sure you discuss any questions you have with your health care provider. Document Released: 09/12/2011 Document Revised: 08/19/2015 Document Reviewed: 01/31/2015 Elsevier Interactive Patient Education  2018 [employer].   How to Take a [employer] A sitz bath is a warm water bath that is taken while you are sitting down. The water should only come up to your hips and should cover your buttocks. Your health care provider may recommend a sitz bath to help you: Clean the lower part of your body, including your genital area. With itching. With pain. With sore muscles or muscles that tighten or spasm.  How to take a sitz bath Take 3-4 sitz baths per day or as told by your health care provider. Partially fill a bathtub with warm water. You will only need the water to be deep enough to cover your hips and buttocks when you are sitting in it. If your health care provider told you to put medicine in the water, follow the directions exactly. Sit  in the water and open the tub drain a little. Turn on the warm water again to keep the tub at the correct level. Keep the water running constantly. Soak in the water for 15-20 minutes or as told by your health care provider. After the sitz bath, pat the affected area dry first. Do not rub it. Be careful when you stand up after the sitz bath because you may feel dizzy.  Contact a health care provider if: Your symptoms get worse. Do not continue with sitz baths if your symptoms get worse. You have new symptoms. Do not continue with sitz baths until you talk with your health care provider. This information is not intended to replace advice given to you by your health care provider. Make sure you discuss any questions you have with your health care provider. Document Released: 12/04/2003 Document Revised: 08/11/2015 Document Reviewed: 03/11/2014 Elsevier Interactive Patient Education  [employer].

## 2021-03-18 ENCOUNTER — Telehealth: Payer: Self-pay | Admitting: Surgery

## 2021-03-18 NOTE — Telephone Encounter (Signed)
Patient has been advised of Pre-Admission date/time, COVID Testing date and Surgery date.  Surgery Date: 04/11/21 Preadmission Testing Date: 04/01/21 (phone 8a-1p) Covid Testing Date: Not needed.    Patient has been made aware to call [phone], between 1-3:00pm the day before surgery, to find out what time to arrive for surgery.

## 2021-04-01 ENCOUNTER — Encounter
Admission: RE | Admit: 2021-04-01 | Discharge: 2021-04-01 | Disposition: A | Discharge status: Home / Self Care | Payer: No Typology Code available for payment source | Source: Ambulatory Visit | Attending: Surgery | Admitting: Surgery

## 2021-04-01 ENCOUNTER — Other Ambulatory Visit: Payer: Self-pay

## 2021-04-01 HISTORY — DX: Unspecified osteoarthritis, unspecified site: M19.90

## 2021-04-01 HISTORY — DX: Anxiety disorder, unspecified: F41.9

## 2021-04-01 NOTE — Patient Instructions (Signed)
Your procedure is scheduled on:04-11-21 Monday Report to the Registration Desk on the 1st floor of the [facility].Then proceed to the 2nd floor Surgery Desk in the [facility] To find out your arrival time, please call [phone] between 1PM - 3PM on:04-08-21 Friday  REMEMBER: Instructions that are not followed completely may result in serious medical risk, up to and including death; or upon the discretion of your surgeon and anesthesiologist your surgery may need to be rescheduled.  Do not eat food after midnight the night before surgery.  No gum chewing, lozengers or hard candies.  You may however, drink CLEAR liquids up to 2 hours before you are scheduled to arrive for your surgery. Do not drink anything within 2 hours of your scheduled arrival time.  Clear liquids include: - water  - apple juice without pulp - gatorade (not RED, PURPLE, OR BLUE) - black coffee or tea (Do NOT add milk or creamers to the coffee or tea) Do NOT drink anything that is not on this list  Do NOT take any medication the day of surgery  One week prior to surgery: Stop Anti-inflammatories (NSAIDS) such as Advil, Aleve, Ibuprofen, Motrin, Naproxen, Naprosyn and Aspirin based products such as Excedrin, Goodys Powder, BC Powder.You may however, continue to take Tylenol if needed for pain up until the day of surgery.  Stop ANY OVER THE COUNTER supplements/vitamins 7 days prior to surgery (Melatonin and Multiple Vitamin )  No Alcohol for 24 hours before or after surgery.  No Smoking including e-cigarettes for 24 hours prior to surgery.  No chewable tobacco products for at least 6 hours prior to surgery.  No nicotine patches on the day of surgery.  Do not use any "recreational" drugs for at least a week prior to your surgery.  Please be advised that the combination of cocaine and anesthesia may have negative outcomes, up to and including death. If you test positive for cocaine, your surgery will be  cancelled.  On the morning of surgery brush your teeth with toothpaste and water, you may rinse your mouth with mouthwash if you wish. Do not swallow any toothpaste or mouthwash.  Do not wear jewelry, make-up, hairpins, clips or nail polish.  Do not wear lotions, powders, or perfumes.   Do not shave body from the neck down 48 hours prior to surgery just in case you cut yourself which could leave a site for infection.  Also, freshly shaved skin may become irritated if using the CHG soap.  Contact lenses, hearing aids and dentures may not be worn into surgery.  Do not bring valuables to the hospital. [facility] is not responsible for any missing/lost belongings or valuables.   Fleets enema as directed-Do 1 Fleet Enema the night before your surgery and another Fleet Enema the morning of surgery 1 hour prior to arrival time  Notify your doctor if there is any change in your medical condition (cold, fever, infection).  Wear comfortable clothing (specific to your surgery type) to the hospital.  After surgery, you can help prevent lung complications by doing breathing exercises.  Take deep breaths and cough every 1-2 hours. Your doctor may order a device called an Incentive Spirometer to help you take deep breaths. When coughing or sneezing, hold a pillow firmly against your incision with both hands. This is called splinting. Doing this helps protect your incision. It also decreases belly discomfort.  If you are being admitted to the hospital overnight, leave your suitcase in the car.  After surgery it may be brought to your room.  If you are being discharged the day of surgery, you will not be allowed to drive home. You will need a responsible adult (18 years or older) to drive you home and stay with you that night.   If you are taking public transportation, you will need to have a responsible adult (18 years or older) with you. Please confirm with your physician that it is acceptable to  use public transportation.   Please call the [facility] at [phone] if you have any questions about these instructions.  Surgery Visitation Policy:  Patients undergoing a surgery or procedure may have one family member or support person with them as long as that person is not COVID-19 positive or experiencing its symptoms.  That person may remain in the waiting area during the procedure and may rotate out with other people.  Inpatient Visitation:    Visiting hours are 7 a.m. to 8 p.m. Up to two visitors ages 16+ are allowed at one time in a patient room. The visitors may rotate out with other people during the day. Visitors must check out when they leave, or other visitors will not be allowed. One designated support person may remain overnight. The visitor must pass COVID-19 screenings, use hand sanitizer when entering and exiting the patients room and wear a mask at all times, including in the patients room. Patients must also wear a mask when staff or their visitor are in the room. Masking is required regardless of vaccination status.    Sodium Phosphate Monobasic; Sodium Phosphate Dibasic enema What is this medication? SODIUM PHOSPATE SALT (SOE dee um  FOS fate  sawlt) is a saline laxative. It is used to treat constipation or to clean the bowel before a colonoscopy. This medicine may be used for other purposes; ask your health care provider or pharmacist if you have questions. COMMON BRAND NAME(S): Fleet, Ready To Use Saline What should I tell my care team before I take this medication? They need to know if you have any of these conditions: abnormal blood levels of electrolytes like sodium, phosphate, potassium or calcium bowel problems like colitis, constipation, and obstruction change in bowel habits lasting more than 2 weeks chest pain dehydration heart failure kidney disease on low salt or sodium diet stomach pain, nausea, vomiting an unusual or  allergic reaction to sodium phosphate, other medicines, foods, dyes, or preservatives pregnant or trying to get pregnant breast-feeding How should I use this medication? This medicine is for rectal use only. Do not take by mouth. Follow the directions on the prescription label. Wash your hands before and after use. Remove tip from enema bottle. Gently insert enema tip into the rectum. Squeeze bottle until almost all of the medicine is inside the rectum. Remove enema tip from the rectum and stay in position until the urge to evacuate is strong. Do not take doses that are larger than those recommended on the product label or otherwise directed by your healthcare professional. Do not take more than one dose in 24 hours. Talk to your pediatrician regarding the use of this medicine in children. While this drug may be prescribed for children as young as 72 years old for selected conditions, precautions do apply. Overdosage: If you think you have taken too much of this medicine contact a poison control center or emergency room at once. NOTE: This medicine is only for you. Do not share this medicine with others. What if I  miss a dose? This does not apply; this medicine is not for regular use. What may interact with this medication? aspirin certain medicines used to treat high blood pressure, like captopril, enalapril, lisinopril, or candesartan, losartan, valsartan diuretics NSAIDS, medicines for pain and inflammation, like ibuprofen or naproxen This list may not describe all possible interactions. Give your health care provider a list of all the medicines, herbs, non-prescription drugs, or dietary supplements you use. Also tell them if you smoke, drink alcohol, or use illegal drugs. Some items may interact with your medicine. What should I watch for while using this medication? Do not use with any other laxatives unless your doctor tells you to. Drink fluids as directed to prevent dehydration. See your  doctor right away if you do not have a bowel movement after using this medicine. What side effects may I notice from receiving this medication? Side effects that you should report to your doctor or health care professional as soon as possible: allergic reactions like skin rash, itching or hives, swelling of the face, lips, or tongue irregular heart beat rectal bleeding seizures Side effects that usually do not require medical attention (report to your doctor or health care professional if they continue or are bothersome): bloating dizziness headache nausea and vomiting stomach pain This list may not describe all possible side effects. Call your doctor for medical advice about side effects. You may report side effects to FDA at 1-800-FDA-1088. Where should I keep my medication? Keep out of the reach of children. Store at room temperature between 15 and 30 degrees C (59 and 86 degrees F). Throw away any unused medicine after the expiration date. NOTE: This sheet is a summary. It may not cover all possible information. If you have questions about this medicine, talk to your doctor, pharmacist, or health care provider.  2022 Elsevier/Gold Standard (2012-04-04 00:00:00)

## 2021-04-11 ENCOUNTER — Ambulatory Visit: Payer: No Typology Code available for payment source | Admitting: Anesthesiology

## 2021-04-11 ENCOUNTER — Telehealth: Payer: Self-pay | Admitting: Surgery

## 2021-04-11 ENCOUNTER — Encounter: Payer: Self-pay | Admitting: Surgery

## 2021-04-11 ENCOUNTER — Other Ambulatory Visit: Payer: Self-pay

## 2021-04-11 ENCOUNTER — Ambulatory Visit
Admission: RE | Admit: 2021-04-11 | Discharge: 2021-04-11 | Disposition: A | Discharge status: Home / Self Care | Payer: No Typology Code available for payment source | Attending: Surgery | Admitting: Surgery

## 2021-04-11 ENCOUNTER — Telehealth: Payer: Self-pay | Admitting: *Deleted

## 2021-04-11 ENCOUNTER — Encounter
Admission: RE | Disposition: A | Discharge status: Home / Self Care | Payer: Self-pay | Source: Home / Self Care | Attending: Surgery

## 2021-04-11 DIAGNOSIS — K648 Other hemorrhoids: Secondary | ICD-10-CM | POA: Diagnosis not present

## 2021-04-11 DIAGNOSIS — Z8719 Personal history of other diseases of the digestive system: Secondary | ICD-10-CM | POA: Insufficient documentation

## 2021-04-11 DIAGNOSIS — K644 Residual hemorrhoidal skin tags: Secondary | ICD-10-CM

## 2021-04-11 HISTORY — PX: EVALUATION UNDER ANESTHESIA WITH HEMORRHOIDECTOMY: SHX5624

## 2021-04-11 SURGERY — EXAM UNDER ANESTHESIA WITH HEMORRHOIDECTOMY
Anesthesia: General | Site: Rectum

## 2021-04-11 MED ORDER — PROPOFOL 500 MG/50ML IV EMUL
INTRAVENOUS | Status: AC
  Filled 2021-04-11: qty 50

## 2021-04-11 MED ORDER — ONDANSETRON HCL 4 MG/2ML IJ SOLN
INTRAMUSCULAR | Status: AC
  Administered 2021-04-11: 4 mg via INTRAVENOUS
  Filled 2021-04-11: qty 2

## 2021-04-11 MED ORDER — LACTATED RINGERS IV SOLN: INTRAVENOUS | Status: DC

## 2021-04-11 MED ORDER — ORAL CARE MOUTH RINSE: 15.0000 mL | Freq: Once | OROMUCOSAL | Status: AC

## 2021-04-11 MED ORDER — SODIUM CHLORIDE 0.9 % IV SOLN
INTRAVENOUS | Status: AC
  Filled 2021-04-11: qty 2

## 2021-04-11 MED ORDER — OXYCODONE HCL 5 MG PO TABS: 5.0000 mg | ORAL_TABLET | ORAL | 0 refills | Status: DC | PRN

## 2021-04-11 MED ORDER — 0.9 % SODIUM CHLORIDE (POUR BTL) OPTIME
TOPICAL | Status: DC | PRN
  Administered 2021-04-11: 500 mL

## 2021-04-11 MED ORDER — OXYCODONE HCL 5 MG PO TABS
ORAL_TABLET | ORAL | Status: AC
  Administered 2021-04-11: 5 mg via ORAL
  Filled 2021-04-11: qty 1

## 2021-04-11 MED ORDER — FAMOTIDINE 20 MG PO TABS: 20.0000 mg | ORAL_TABLET | Freq: Once | ORAL | Status: AC

## 2021-04-11 MED ORDER — CHLORHEXIDINE GLUCONATE 0.12 % MT SOLN
OROMUCOSAL | Status: AC
  Administered 2021-04-11: 15 mL via OROMUCOSAL
  Filled 2021-04-11: qty 15

## 2021-04-11 MED ORDER — GABAPENTIN 300 MG PO CAPS: 300.0000 mg | ORAL_CAPSULE | ORAL | Status: AC

## 2021-04-11 MED ORDER — MIDAZOLAM HCL 2 MG/2ML IJ SOLN
INTRAMUSCULAR | Status: AC
  Filled 2021-04-11: qty 2

## 2021-04-11 MED ORDER — GABAPENTIN 300 MG PO CAPS
ORAL_CAPSULE | ORAL | Status: AC
  Administered 2021-04-11: 300 mg via ORAL
  Filled 2021-04-11: qty 1

## 2021-04-11 MED ORDER — GELATIN ABSORBABLE 100 CM EX MISC
CUTANEOUS | Status: AC
  Filled 2021-04-11: qty 1

## 2021-04-11 MED ORDER — FAMOTIDINE 20 MG PO TABS
ORAL_TABLET | ORAL | Status: AC
  Administered 2021-04-11: 20 mg via ORAL
  Filled 2021-04-11: qty 1

## 2021-04-11 MED ORDER — BUPIVACAINE LIPOSOME 1.3 % IJ SUSP: 20.0000 mL | Freq: Once | INTRAMUSCULAR | Status: DC

## 2021-04-11 MED ORDER — BUPIVACAINE-EPINEPHRINE (PF) 0.5% -1:200000 IJ SOLN
INTRAMUSCULAR | Status: AC
  Filled 2021-04-11: qty 30

## 2021-04-11 MED ORDER — GELATIN ABSORBABLE 100 EX MISC
CUTANEOUS | Status: DC | PRN
  Administered 2021-04-11: 1

## 2021-04-11 MED ORDER — SEVOFLURANE IN SOLN
RESPIRATORY_TRACT | Status: AC
  Filled 2021-04-11: qty 250

## 2021-04-11 MED ORDER — FENTANYL CITRATE (PF) 100 MCG/2ML IJ SOLN: 25.0000 ug | INTRAMUSCULAR | Status: DC | PRN

## 2021-04-11 MED ORDER — MIDAZOLAM HCL 2 MG/2ML IJ SOLN
INTRAMUSCULAR | Status: DC | PRN
  Administered 2021-04-11: 2 mg via INTRAVENOUS

## 2021-04-11 MED ORDER — FENTANYL CITRATE (PF) 100 MCG/2ML IJ SOLN
INTRAMUSCULAR | Status: AC
  Administered 2021-04-11: 25 ug via INTRAVENOUS
  Filled 2021-04-11: qty 2

## 2021-04-11 MED ORDER — DEXAMETHASONE SODIUM PHOSPHATE 10 MG/ML IJ SOLN
INTRAMUSCULAR | Status: DC | PRN
Start: 2021-04-11 — End: 2021-04-11
  Administered 2021-04-11: 5 mg via INTRAVENOUS

## 2021-04-11 MED ORDER — IBUPROFEN 800 MG PO TABS: 800.0000 mg | ORAL_TABLET | Freq: Three times a day (TID) | ORAL | 1 refills | Status: DC | PRN

## 2021-04-11 MED ORDER — CHLORHEXIDINE GLUCONATE CLOTH 2 % EX PADS: 6.0000 | MEDICATED_PAD | Freq: Once | CUTANEOUS | Status: DC

## 2021-04-11 MED ORDER — BUPIVACAINE-EPINEPHRINE (PF) 0.5% -1:200000 IJ SOLN
INTRAMUSCULAR | Status: DC | PRN
  Administered 2021-04-11: 50 mL

## 2021-04-11 MED ORDER — FLEET ENEMA 7-19 GM/118ML RE ENEM: 1.0000 | ENEMA | Freq: Once | RECTAL | Status: DC

## 2021-04-11 MED ORDER — OXYCODONE HCL 5 MG PO TABS: 5.0000 mg | ORAL_TABLET | Freq: Once | ORAL | Status: AC

## 2021-04-11 MED ORDER — ACETAMINOPHEN 500 MG PO TABS: 1000.0000 mg | ORAL_TABLET | ORAL | Status: AC

## 2021-04-11 MED ORDER — PROPOFOL 10 MG/ML IV BOLUS
INTRAVENOUS | Status: DC | PRN
  Administered 2021-04-11: 200 mg via INTRAVENOUS
  Administered 2021-04-11: 30 mg via INTRAVENOUS

## 2021-04-11 MED ORDER — PHENYLEPHRINE HCL (PRESSORS) 10 MG/ML IV SOLN
INTRAVENOUS | Status: AC
  Filled 2021-04-11: qty 1

## 2021-04-11 MED ORDER — FENTANYL CITRATE (PF) 100 MCG/2ML IJ SOLN
INTRAMUSCULAR | Status: AC
  Filled 2021-04-11: qty 2

## 2021-04-11 MED ORDER — LIDOCAINE HCL (CARDIAC) PF 100 MG/5ML IV SOSY
PREFILLED_SYRINGE | INTRAVENOUS | Status: DC | PRN
  Administered 2021-04-11: 80 mg via INTRAVENOUS

## 2021-04-11 MED ORDER — ONDANSETRON HCL 4 MG/2ML IJ SOLN
INTRAMUSCULAR | Status: DC | PRN
Start: 2021-04-11 — End: 2021-04-11
  Administered 2021-04-11: 4 mg via INTRAVENOUS

## 2021-04-11 MED ORDER — KETOROLAC TROMETHAMINE 30 MG/ML IJ SOLN
INTRAMUSCULAR | Status: DC | PRN
Start: 2021-04-11 — End: 2021-04-11
  Administered 2021-04-11: 30 mg via INTRAVENOUS

## 2021-04-11 MED ORDER — ONDANSETRON HCL 4 MG/2ML IJ SOLN: 4.0000 mg | Freq: Once | INTRAMUSCULAR | Status: AC | PRN

## 2021-04-11 MED ORDER — LIDOCAINE 5 % EX OINT: 1.0000 "application " | TOPICAL_OINTMENT | Freq: Four times a day (QID) | CUTANEOUS | 0 refills | Status: DC | PRN

## 2021-04-11 MED ORDER — FLEET ENEMA 7-19 GM/118ML RE ENEM
1.0000 | ENEMA | Freq: Once | RECTAL | Status: AC
  Administered 2021-04-11: 1 via RECTAL

## 2021-04-11 MED ORDER — SODIUM CHLORIDE 0.9 % IV SOLN
2.0000 g | INTRAVENOUS | Status: AC
  Administered 2021-04-11: 2 g via INTRAVENOUS

## 2021-04-11 MED ORDER — ACETAMINOPHEN 500 MG PO TABS
ORAL_TABLET | ORAL | Status: AC
  Administered 2021-04-11: 1000 mg via ORAL
  Filled 2021-04-11: qty 2

## 2021-04-11 MED ORDER — BUPIVACAINE LIPOSOME 1.3 % IJ SUSP
INTRAMUSCULAR | Status: AC
  Filled 2021-04-11: qty 20

## 2021-04-11 MED ORDER — CHLORHEXIDINE GLUCONATE 0.12 % MT SOLN: 15.0000 mL | Freq: Once | OROMUCOSAL | Status: AC

## 2021-04-11 MED ORDER — ACETAMINOPHEN 500 MG PO TABS: 1000.0000 mg | ORAL_TABLET | Freq: Four times a day (QID) | ORAL | Status: DC | PRN

## 2021-04-11 MED ORDER — PROPOFOL 10 MG/ML IV BOLUS
INTRAVENOUS | Status: AC
  Filled 2021-04-11: qty 40

## 2021-04-11 MED ORDER — FENTANYL CITRATE (PF) 100 MCG/2ML IJ SOLN
INTRAMUSCULAR | Status: DC | PRN
  Administered 2021-04-11 (×2): 25 ug via INTRAVENOUS
  Administered 2021-04-11: 50 ug via INTRAVENOUS

## 2021-04-11 SURGICAL SUPPLY — 33 items
BRIEF STRETCH FOR OB PAD XXL (UNDERPADS AND DIAPERS) ×2 IMPLANT
DRAPE PERI LITHO V/GYN (MISCELLANEOUS) ×2 IMPLANT
DRAPE UNDER BUTTOCK W/FLU (DRAPES) ×2 IMPLANT
DRSG GAUZE FLUFF 36X18 (GAUZE/BANDAGES/DRESSINGS) ×2 IMPLANT
ELECT CAUTERY BLADE TIP 2.5 (TIP) ×2
ELECT REM PT RETURN 9FT ADLT (ELECTROSURGICAL) ×2
ELECTRODE CAUTERY BLDE TIP 2.5 (TIP) ×1 IMPLANT
ELECTRODE REM PT RTRN 9FT ADLT (ELECTROSURGICAL) ×1 IMPLANT
GAUZE 4X4 16PLY ~~LOC~~+RFID DBL (SPONGE) ×2 IMPLANT
GLOVE SURG SYN 7.0 (GLOVE) ×2 IMPLANT
GLOVE SURG SYN 7.0 PF PI (GLOVE) ×1 IMPLANT
GLOVE SURG SYN 7.5  E (GLOVE) ×1
GLOVE SURG SYN 7.5 E (GLOVE) ×1 IMPLANT
GLOVE SURG SYN 7.5 PF PI (GLOVE) ×1 IMPLANT
GOWN STRL REUS W/ TWL LRG LVL3 (GOWN DISPOSABLE) ×2 IMPLANT
GOWN STRL REUS W/TWL LRG LVL3 (GOWN DISPOSABLE) ×2
KIT TURNOVER KIT A (KITS) ×2 IMPLANT
LABEL OR SOLS (LABEL) ×2 IMPLANT
MANIFOLD NEPTUNE II (INSTRUMENTS) ×2 IMPLANT
NEEDLE HYPO 22GX1.5 SAFETY (NEEDLE) ×2 IMPLANT
NS IRRIG 500ML POUR BTL (IV SOLUTION) ×2 IMPLANT
PACK BASIN MINOR ARMC (MISCELLANEOUS) ×2 IMPLANT
PAD OB MATERNITY 4.3X12.25 (PERSONAL CARE ITEMS) ×2 IMPLANT
PAD PREP 24X41 OB/GYN DISP (PERSONAL CARE ITEMS) ×2 IMPLANT
SHEARS HARMONIC 9CM CVD (BLADE) ×2 IMPLANT
SOL PREP PVP 2OZ (MISCELLANEOUS) ×2
SOLUTION PREP PVP 2OZ (MISCELLANEOUS) ×1 IMPLANT
SURGILUBE 2OZ TUBE FLIPTOP (MISCELLANEOUS) ×2 IMPLANT
SUT VIC AB 2-0 SH 27 (SUTURE) ×2
SUT VIC AB 2-0 SH 27XBRD (SUTURE) ×2 IMPLANT
SYR 10ML LL (SYRINGE) ×2 IMPLANT
SYR BULB IRRIG 60ML STRL (SYRINGE) ×2 IMPLANT
WATER STERILE IRR 500ML POUR (IV SOLUTION) ×2 IMPLANT

## 2021-04-11 NOTE — Telephone Encounter (Signed)
Pt called after receiving a post-op rx from Dr. Hampton Abbot today (EUA w/hemorrhoidectomy).  He states the [facility] ([facility] is unable to fill the rx for Oxycodone 5 mg (not enough in stock), so he would like a new rx to be sent to  [facility]) instead, please.  He has called that location & confirmed they have it in stock.  All other rx were able to be filled @ the original location. Jonni Sanger is aware the msg will be routed to Dr. Hampton Abbot for processing.  Thank you

## 2021-04-11 NOTE — Anesthesia Procedure Notes (Signed)
Procedure Name: LMA Insertion Date/Time: 04/11/2021 7:36 AM Performed by: Lowry Bowl, CRNA Pre-anesthesia Checklist: Patient identified, Suction available, Emergency Drugs available and Patient being monitored Patient Re-evaluated:Patient Re-evaluated prior to induction Oxygen Delivery Method: Circle system utilized Preoxygenation: Pre-oxygenation with 100% oxygen Induction Type: IV induction Ventilation: Mask ventilation without difficulty LMA: LMA inserted LMA Size: 5.0 Number of attempts: 2 (LMA 4 was too small so replaced w/ LMA 5) Placement Confirmation: positive ETCO2 and breath sounds checked- equal and bilateral Tube secured with: Tape Dental Injury: Teeth and Oropharynx as per pre-operative assessment

## 2021-04-11 NOTE — Op Note (Signed)
Procedure Date:  04/11/2021  Pre-operative Diagnosis:  Internal and external hemorrhoids  Post-operative Diagnosis: Internal and external hemorrhoids  Procedure:  Exam under Anesthesia, Hemorrhoidectomy of three columns  Surgeon:  Howie Ill, MD  Anesthesia:  General endotracheal  Estimated Blood Loss:  10 ml  Specimens: Left lateral column Right posterior column Right anterior column  Complications:  None  Indications for Procedure:  This is a 46 y.o. male with a history of internal and external hemorrhoids, with prior history of external hemorrhoid thrombosis, presenting for hemorrhoidectomy.  The risks of bleeding, infection, bowel injury, and need for further procedures were all discussed with the patient and he was willing to proceed.   Description of Procedure: The patient was correctly identified in the preoperative area and brought into the operating room.  The patient was placed supine with VTE prophylaxis in place.  Appropriate time-outs were performed.  Anesthesia was induced and the patient was intubated.  Appropriate antibiotics were infused.  The patient was then placed in high lithotomy position.   The perianal area was prepped and draped in usual sterile fashion.  The sphincter was digitally dilated.  Then the anoscope was inserted and the anal canal was evaluated, revealing enlarged internal components of all three columns, with left lateral being largest.  Externally, all columns were enlarged, also with left lateral being largest.  The bivalve retractor was then inserted, and we started with hemorrhoidectomy of the left lateral column using cautery at the external edge and the Harmonic for the internal portion, with careful attention to avoid any injury or involvement of the sphincter muscles.  Then, we proceeded in the same fashion with the right posterior column, and finally the right anterior column.  Hemostasis was controlled with cautery.  Then, the anal canal  was irrigated.  50 ml of Exparel solution was infiltrated into the perianal area and as bilateral pudendal blocks.  A large gelfoam gauze was rolled and inserted into the anal canal for further hemostasis.  The perianal area was cleaned and dressed with fluffed gauze and mesh underwear.  The patient was emerged from anesthesia and extubated and brought to the recovery room for further management.  The patient tolerated the procedure well and all counts were correct at the end of the case.   Howie Ill, MD

## 2021-04-11 NOTE — Interval H&P Note (Signed)
History and Physical Interval Note:  04/11/2021 7:11 AM  Justin Short  has presented today for surgery, with the diagnosis of Internal and external hemorrhoids.  The various methods of treatment have been discussed with the patient and family. After consideration of risks, benefits and other options for treatment, the patient has consented to  Procedure(s): EXAM UNDER ANESTHESIA WITH HEMORRHOIDECTOMY (N/A) as a surgical intervention.  The patient's history has been reviewed, patient examined, no change in status, stable for surgery.  I have reviewed the patient's chart and labs.  Questions were answered to the patient's satisfaction.     Roman Sandall

## 2021-04-11 NOTE — Anesthesia Preprocedure Evaluation (Signed)
Anesthesia Evaluation  Patient identified by MRN, date of birth, ID band Patient awake    Reviewed: Allergy & Precautions, NPO status , Patient's Chart, lab work & pertinent test results  Airway Mallampati: II  TM Distance: >3 FB Neck ROM: full    Dental  (+) Teeth Intact   Pulmonary neg pulmonary ROS, Current Smoker,    Pulmonary exam normal breath sounds clear to auscultation       Cardiovascular Exercise Tolerance: Good negative cardio ROS Normal cardiovascular exam Rhythm:Regular Rate:Normal     Neuro/Psych negative neurological ROS  negative psych ROS   GI/Hepatic negative GI ROS, Neg liver ROS,   Endo/Other  negative endocrine ROS  Renal/GU negative Renal ROS  negative genitourinary   Musculoskeletal  (+) Arthritis ,   Abdominal Normal abdominal exam  (+)   Peds negative pediatric ROS (+)  Hematology negative hematology ROS (+)   Anesthesia Other Findings Past Medical History: No date: Anal fissure No date: Anxiety     Comment:  no meds No date: Arthritis No date: Hemorrhoids     Comment:  always present No date: Kidney disease     Comment:  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)but when he went back to pcp said it looks as               though pt has both functioning kidneys  Past Surgical History: No date: GANGLION CYST EXCISION; Left     Comment:  pinky finger 2006: sphincterotomy     Comment:  lateral internal sphincterotomy for anal fissure  BMI    Body Mass Index: 24.89 kg/m      Reproductive/Obstetrics negative OB ROS                             Anesthesia Physical Anesthesia Plan  ASA: 2  Anesthesia Plan: General   Post-op Pain Management:    Induction: Intravenous  PONV Risk Score and Plan: Dexamethasone, Ondansetron, Midazolam and Treatment may vary due to age or medical condition  Airway Management Planned:  LMA  Additional Equipment:   Intra-op Plan:   Post-operative Plan: Extubation in OR  Informed Consent: I have reviewed the patients History and Physical, chart, labs and discussed the procedure including the risks, benefits and alternatives for the proposed anesthesia with the patient or authorized representative who has indicated his/her understanding and acceptance.     Dental Advisory Given  Plan Discussed with: CRNA and Surgeon  Anesthesia Plan Comments:         Anesthesia Quick Evaluation

## 2021-04-11 NOTE — Transfer of Care (Signed)
Immediate Anesthesia Transfer of Care Note  Patient: Justin Short  Procedure(s) Performed: EXAM UNDER ANESTHESIA WITH HEMORRHOIDECTOMY (Rectum)  Patient Location: PACU  Anesthesia Type:General  Level of Consciousness: awake, drowsy and patient cooperative  Airway & Oxygen Therapy: Patient Spontanous Breathing  Post-op Assessment: Report given to RN and Post -op Vital signs reviewed and stable  Post vital signs: Reviewed and stable  Last Vitals:  Vitals Value Taken Time  BP 137/86 04/11/21 0847  Temp 36.8 C 04/11/21 0847  Pulse 86 04/11/21 0849  Resp 12 04/11/21 0849  SpO2 94 % 04/11/21 0849  Vitals shown include unvalidated device data.  Last Pain:  Vitals:   04/11/21 7253  TempSrc: Oral  PainSc: 0-No pain         Complications: No notable events documented.

## 2021-04-11 NOTE — Telephone Encounter (Signed)
Faxed FMLA to [employer] [phone]

## 2021-04-11 NOTE — Discharge Instructions (Signed)

## 2021-04-11 NOTE — Progress Notes (Signed)
°   04/11/21 0715  Clinical Encounter Type  Visited With Patient  Visit Type Initial;Pre-op

## 2021-04-11 NOTE — Anesthesia Postprocedure Evaluation (Signed)
Anesthesia Post Note  Patient: Justin Short  Procedure(s) Performed: EXAM UNDER ANESTHESIA WITH HEMORRHOIDECTOMY (Rectum)  Patient location during evaluation: PACU Anesthesia Type: General Level of consciousness: awake and awake and alert Pain management: pain level controlled Vital Signs Assessment: post-procedure vital signs reviewed and stable Respiratory status: spontaneous breathing and respiratory function stable Cardiovascular status: stable Anesthetic complications: no   No notable events documented.   Last Vitals:  Vitals:   04/11/21 0930 04/11/21 0942  BP: 118/89 120/88  Pulse: 66 68  Resp: 14 15  Temp: 36.6 C (!) 36.3 C  SpO2: 96% 98%    Last Pain:  Vitals:   04/11/21 0942  TempSrc: Tympanic  PainSc: 0-No pain                 VAN STAVEREN,Tauriel Scronce

## 2021-04-12 ENCOUNTER — Encounter: Payer: Self-pay | Admitting: Surgery

## 2021-04-12 LAB — SURGICAL PATHOLOGY

## 2021-04-22 ENCOUNTER — Telehealth: Payer: Self-pay | Admitting: Surgery

## 2021-04-22 NOTE — Telephone Encounter (Signed)
Called patient at this time-he states he is having bowel movements daily-he has anxiety over eating because of having to have bowel movements-patient stated he is having bowel movement while using the sitz bath. He was encouraged to stop the dulcolax due to the cramping and feeling nausea. He does not like taking the Oxycodone-only been taking 1/2 tablet every day. We discussed  taking Tylenol and ibuprofen. We also discussed drinking boost/ensure for nutrition. Patient stated he felt better after eating a biscuit this morning.

## 2021-04-22 NOTE — Telephone Encounter (Signed)
Patient calls, he had hemorrhoidectomy done on 04/11/21 with Dr. Aleen Campi.  Patient is very concerned, prior to surgery was about 145 lbs, now down to 130 lbs.  He is having a lot of pain from the surgery.  He is also having a lot of anxiety because he knows that he has to eat, but can't.  Is taking the Oxycodone, but only doing 1/2 along with Ibuprofen.  States the Oxy is making him very jittery.  Still using the sitz bath.  Did notice some blood in stool, but not the past 2 days.  He states is just very miserable.  Does have a post op on 04/25/21, but patient just very concerned about getting through the weekend.  He knows he needs to eat because of the medicine, but knows it will also have to come out and having anxiety with this as it just really hurts, painful and also meds possibly causing some constipation.  Used Miralax and last night was miserable.  Please call him. Thank you.

## 2021-04-25 ENCOUNTER — Ambulatory Visit (INDEPENDENT_AMBULATORY_CARE_PROVIDER_SITE_OTHER): Payer: No Typology Code available for payment source | Admitting: Physician Assistant

## 2021-04-25 ENCOUNTER — Other Ambulatory Visit: Payer: Self-pay

## 2021-04-25 ENCOUNTER — Encounter: Payer: Self-pay | Admitting: Physician Assistant

## 2021-04-25 VITALS — BP 132/89 | HR 90 | Temp 98.7°F | Ht 64.0 in | Wt 132.2 lb

## 2021-04-25 DIAGNOSIS — Z09 Encounter for follow-up examination after completed treatment for conditions other than malignant neoplasm: Secondary | ICD-10-CM

## 2021-04-25 DIAGNOSIS — K644 Residual hemorrhoidal skin tags: Secondary | ICD-10-CM

## 2021-04-25 DIAGNOSIS — K648 Other hemorrhoids: Secondary | ICD-10-CM

## 2021-04-25 DIAGNOSIS — K645 Perianal venous thrombosis: Secondary | ICD-10-CM

## 2021-04-25 NOTE — Progress Notes (Signed)
[facility] POST-OP OFFICE VISIT  04/25/2021  HPI: Justin Short is a 46 y.o. male 14 days s/p Exam under Anesthesia, Hemorrhoidectomy of three columns, with Dr Hampton Abbot  He is doing well He reports he was "numb" for the first few days and then that Thursday the pain returned, quickly improved over the weekend. No only sore with wiping Using tylenol, ibuprofen, and lidocaine cream He has been utilizing Sitz baths multiple times a day and after bowel movements No fever, chills No other complaints  Vital signs: BP 132/89    Pulse 90    Temp 98.7 F (37.1 C) (Oral)    Ht 5\' 4"  (1.626 m)    Wt 132 lb 3.2 oz (60 kg)    SpO2 99%    BMI 22.69 kg/m    Physical Exam: Constitutional: Well appearing male, NAD GU/GI: Chaperone present, hemorrhoidectomy sites healing well, no erythema, no wound drainage.   Assessment/Plan: This is a 46 y.o. male 14 days s/p Exam under Anesthesia, Hemorrhoidectomy of three columns, with Dr Hampton Abbot   - Pain control prn; will refill lidocaine cream Rx. He states he has enough ibuprofen but he will call if needed for refill   - Reviewed wound care recommendation; continue to utilize Sitz baths, good hygiene especially with BMs  - He can follow up in ~2 weeks for recheck   -- Edison Simon, PA-C [facility] 04/25/2021, 10:26 AM [phone] M-F: 7am - 4pm

## 2021-04-25 NOTE — Patient Instructions (Addendum)
Continue the [employer]. You may try benadryl at night for the itching.  Please pick up your prescription at your pharmacy.   If you have any concerns or questions, please feel free to call our office. See follow up appointment below.   Surgical Procedures for Hemorrhoids, Care After This sheet gives you information about how to care for yourself after your procedure. Your health care provider may also give you more specific instructions. If you have problems or questions, contact your health care provider. What can I expect after the procedure? After the procedure, it is common to have: Rectal pain. Pain when you are having a bowel movement. Slight rectal bleeding. This is more likely to happen with the first bowel movement after surgery. Follow these instructions at home: Medicines Take over-the-counter and prescription medicines only as told by your health care provider. If you were prescribed an antibiotic medicine, use it as told by your health care provider. Do not stop using the antibiotic even if your condition improves. Ask your health care provider if the medicine prescribed to you requires you to avoid driving or using heavy machinery. Use a stool softener or a bulk laxative as told by your health care provider. Eating and drinking Follow instructions from your health care provider about what to eat or drink after your procedure. You may need to take actions to prevent or treat constipation, such as: Drink enough fluid to keep your urine pale yellow. Take over-the-counter or prescription medicines. Eat foods that are high in fiber, such as beans, whole grains, and fresh fruits and vegetables. Limit foods that are high in fat and processed sugars, such as fried or sweet foods. Activity  Rest as told by your health care provider. Avoid sitting for a long time without moving. Get up to take short walks every 1-2 hours. This is important to improve blood flow and breathing. Ask  for help if you feel weak or unsteady. Return to your normal activities as told by your health care provider. Ask your health care provider what activities are safe for you. Do not lift anything that is heavier than 10 lb (4.5 kg), or the limit that you are told, until your health care provider says that it is safe. Do not strain to have a bowel movement. Do not spend a long time sitting on the toilet. General instructions  Take warm sitz baths for 15-20 minutes, 2-3 times a day to relieve soreness or itching and to keep the rectal area clean. Apply ice packs to the area to reduce swelling and pain. Do not drive for 24 hours if you were given a sedative during your procedure. Keep all follow-up visits as told by your health care provider. This is important. Contact a health care provider if: Your pain medicine is not helping. You have a fever or chills. You have bad smelling drainage. You have a lot of swelling. You become constipated. You have trouble passing urine. Get help right away if: You have very bad rectal pain. You have heavy bleeding from your rectum. Summary After the procedure, it is common to have pain and slight rectal bleeding. Take warm sitz baths for 15-20 minutes, 2-3 times a day to relieve soreness or itching and to keep the rectal area clean. Avoid straining when having a bowel movement. Eat foods that are high in fiber, such as beans, whole grains, and fresh fruits and vegetables. Take over-the-counter and prescription medicines only as told by your health care provider. This  information is not intended to replace advice given to you by your health care provider. Make sure you discuss any questions you have with your health care provider. Document Revised: 09/22/2020 Document Reviewed: 09/22/2020 Elsevier Patient Education  [place].

## 2021-05-13 ENCOUNTER — Encounter: Payer: Self-pay | Admitting: Surgery

## 2021-05-13 ENCOUNTER — Other Ambulatory Visit: Payer: Self-pay

## 2021-05-13 ENCOUNTER — Ambulatory Visit (INDEPENDENT_AMBULATORY_CARE_PROVIDER_SITE_OTHER): Payer: No Typology Code available for payment source | Admitting: Surgery

## 2021-05-13 VITALS — BP 151/95 | HR 101 | Temp 98.3°F | Ht 64.0 in | Wt 131.0 lb

## 2021-05-13 DIAGNOSIS — K648 Other hemorrhoids: Secondary | ICD-10-CM

## 2021-05-13 DIAGNOSIS — Z09 Encounter for follow-up examination after completed treatment for conditions other than malignant neoplasm: Secondary | ICD-10-CM

## 2021-05-13 DIAGNOSIS — K644 Residual hemorrhoidal skin tags: Secondary | ICD-10-CM

## 2021-05-13 NOTE — Patient Instructions (Addendum)
Please pick up prescription at your pharmacy.   If you have any concerns or questions, please feel free to call our office.   Surgical Procedures for Hemorrhoids, Care After This sheet gives you information about how to care for yourself after your procedure. Your health care provider may also give you more specific instructions. If you have problems or questions, contact your health care provider. What can I expect after the procedure? After the procedure, it is common to have: Rectal pain. Pain when you are having a bowel movement. Slight rectal bleeding. This is more likely to happen with the first bowel movement after surgery. Follow these instructions at home: Medicines Take over-the-counter and prescription medicines only as told by your health care provider. If you were prescribed an antibiotic medicine, use it as told by your health care provider. Do not stop using the antibiotic even if your condition improves. Ask your health care provider if the medicine prescribed to you requires you to avoid driving or using heavy machinery. Use a stool softener or a bulk laxative as told by your health care provider. Eating and drinking Follow instructions from your health care provider about what to eat or drink after your procedure. You may need to take actions to prevent or treat constipation, such as: Drink enough fluid to keep your urine pale yellow. Take over-the-counter or prescription medicines. Eat foods that are high in fiber, such as beans, whole grains, and fresh fruits and vegetables. Limit foods that are high in fat and processed sugars, such as fried or sweet foods. Activity  Rest as told by your health care provider. Avoid sitting for a long time without moving. Get up to take short walks every 1-2 hours. This is important to improve blood flow and breathing. Ask for help if you feel weak or unsteady. Return to your normal activities as told by your health care provider. Ask  your health care provider what activities are safe for you. Do not lift anything that is heavier than 10 lb (4.5 kg), or the limit that you are told, until your health care provider says that it is safe. Do not strain to have a bowel movement. Do not spend a long time sitting on the toilet. General instructions  Take warm sitz baths for 15-20 minutes, 2-3 times a day to relieve soreness or itching and to keep the rectal area clean. Apply ice packs to the area to reduce swelling and pain. Do not drive for 24 hours if you were given a sedative during your procedure. Keep all follow-up visits as told by your health care provider. This is important. Contact a health care provider if: Your pain medicine is not helping. You have a fever or chills. You have bad smelling drainage. You have a lot of swelling. You become constipated. You have trouble passing urine. Get help right away if: You have very bad rectal pain. You have heavy bleeding from your rectum. Summary After the procedure, it is common to have pain and slight rectal bleeding. Take warm sitz baths for 15-20 minutes, 2-3 times a day to relieve soreness or itching and to keep the rectal area clean. Avoid straining when having a bowel movement. Eat foods that are high in fiber, such as beans, whole grains, and fresh fruits and vegetables. Take over-the-counter and prescription medicines only as told by your health care provider. This information is not intended to replace advice given to you by your health care provider. Make sure you discuss any  questions you have with your health care provider. Document Revised: 09/22/2020 Document Reviewed: 09/22/2020 Elsevier Patient Education  2022 [employer].

## 2021-05-14 ENCOUNTER — Encounter: Payer: Self-pay | Admitting: Surgery

## 2021-05-14 NOTE — Progress Notes (Signed)
05/13/2021  HPI: Justin Short is a 46 y.o. male s/p exam under anesthesia and hemorrhoidectomy on 04/11/2021.  Patient presents today for follow-up.  Patient reports that at first he had quite significant amount of pain but does has been improving since.  Still reports some pain with bowel movements particularly with wiping.  Denies any significant blood.  He has been using sitz bath's, using the medications as instructed, taking MiraLAX and fiber supplements.  Vital signs: BP (!) 151/95    Pulse (!) 101    Temp 98.3 F (36.8 C)    Ht 5\' 4"  (1.626 m)    Wt 131 lb (59.4 kg)    SpO2 98%    BMI 22.49 kg/m    Physical Exam: Constitutional: No acute distress Rectal: External exam reveals appropriately healing tissue anteriorly and laterally but still an area of open wound posteriorly.  This likely were he is having the most discomfort and related to his pain.  No gross blood noted.  Rectal exam deferred due to discomfort.  Assessment/Plan: This is a 45 y.o. male s/p exam under anesthesia and hemorrhoidectomy.  - Discussed with him that some of the pain that he is having is related to the area that still open wound posteriorly.  This is causing likely some inflammatory changes in pain sensation with bowel movements and wiping.  As the tissues continue to heal, this should start improving as well.  Currently there is no evidence of infection or worsening of the wound.  Instructed to continue the lidocaine ointment, MiraLAX, sitz bath's, and will refill the patient's oxycodone prescription so he can have this for pain control.  He is otherwise been doing well and things seem to be healing appropriately. - Follow-up in 1 month to reassess his progress.   60, MD [facility]

## 2021-05-26 ENCOUNTER — Telehealth: Payer: Self-pay | Admitting: *Deleted

## 2021-05-26 NOTE — Telephone Encounter (Signed)
Faxed updated FMLA to [employer] at [phone] ?

## 2021-06-10 ENCOUNTER — Ambulatory Visit (INDEPENDENT_AMBULATORY_CARE_PROVIDER_SITE_OTHER): Payer: No Typology Code available for payment source | Admitting: Surgery

## 2021-06-10 ENCOUNTER — Other Ambulatory Visit: Payer: Self-pay

## 2021-06-10 ENCOUNTER — Encounter: Payer: Self-pay | Admitting: Surgery

## 2021-06-10 VITALS — BP 107/70 | HR 63 | Temp 98.5°F | Wt 128.4 lb

## 2021-06-10 DIAGNOSIS — Z09 Encounter for follow-up examination after completed treatment for conditions other than malignant neoplasm: Secondary | ICD-10-CM

## 2021-06-10 DIAGNOSIS — K648 Other hemorrhoids: Secondary | ICD-10-CM

## 2021-06-10 DIAGNOSIS — K644 Residual hemorrhoidal skin tags: Secondary | ICD-10-CM

## 2021-06-10 MED ORDER — LIDOCAINE 5 % EX OINT: 1.0000 "application " | TOPICAL_OINTMENT | Freq: Four times a day (QID) | CUTANEOUS | 0 refills | Status: DC | PRN

## 2021-06-10 NOTE — Patient Instructions (Addendum)
If you have any concerns or questions, please feel free to call our office. Follow up as needed.  ? ?Surgical Procedures for Hemorrhoids, Care After ?This sheet gives you information about how to care for yourself after your procedure. Your health care provider may also give you more specific instructions. If you have problems or questions, contact your health care provider. ?What can I expect after the procedure? ?After the procedure, it is common to have: ?Rectal pain. ?Pain when you are having a bowel movement. ?Slight rectal bleeding. This is more likely to happen with the first bowel movement after surgery. ?Follow these instructions at home: ?Medicines ?Take over-the-counter and prescription medicines only as told by your health care provider. ?If you were prescribed an antibiotic medicine, use it as told by your health care provider. Do not stop using the antibiotic even if your condition improves. ?Ask your health care provider if the medicine prescribed to you requires you to avoid driving or using heavy machinery. ?Use a stool softener or a bulk laxative as told by your health care provider. ?Eating and drinking ?Follow instructions from your health care provider about what to eat or drink after your procedure. ?You may need to take actions to prevent or treat constipation, such as: ?Drink enough fluid to keep your urine pale yellow. ?Take over-the-counter or prescription medicines. ?Eat foods that are high in fiber, such as beans, whole grains, and fresh fruits and vegetables. ?Limit foods that are high in fat and processed sugars, such as fried or sweet foods. ?Activity ? ?Rest as told by your health care provider. ?Avoid sitting for a long time without moving. Get up to take short walks every 1-2 hours. This is important to improve blood flow and breathing. Ask for help if you feel weak or unsteady. ?Return to your normal activities as told by your health care provider. Ask your health care provider  what activities are safe for you. ?Do not lift anything that is heavier than 10 lb (4.5 kg), or the limit that you are told, until your health care provider says that it is safe. ?Do not strain to have a bowel movement. ?Do not spend a long time sitting on the toilet. ?General instructions ? ?Take warm sitz baths for 15-20 minutes, 2-3 times a day to relieve soreness or itching and to keep the rectal area clean. ?Apply ice packs to the area to reduce swelling and pain. ?Do not drive for 24 hours if you were given a sedative during your procedure. ?Keep all follow-up visits as told by your health care provider. This is important. ?Contact a health care provider if: ?Your pain medicine is not helping. ?You have a fever or chills. ?You have bad smelling drainage. ?You have a lot of swelling. ?You become constipated. ?You have trouble passing urine. ?Get help right away if: ?You have very bad rectal pain. ?You have heavy bleeding from your rectum. ?Summary ?After the procedure, it is common to have pain and slight rectal bleeding. ?Take warm sitz baths for 15-20 minutes, 2-3 times a day to relieve soreness or itching and to keep the rectal area clean. ?Avoid straining when having a bowel movement. ?Eat foods that are high in fiber, such as beans, whole grains, and fresh fruits and vegetables. ?Take over-the-counter and prescription medicines only as told by your health care provider. ?This information is not intended to replace advice given to you by your health care provider. Make sure you discuss any questions you have with your  health care provider. ?Document Revised: 09/22/2020 Document Reviewed: 09/22/2020 ?Elsevier Patient Education ? [place]. ? ?

## 2021-06-10 NOTE — Progress Notes (Signed)
06/10/2021 ? ?HPI: ?Justin Short is a 46 y.o. male s/p EUA and hemorrhoidectomy of three columns on 04/11/21.  Patient presents for follow up.  Reports that he continues to make some progress compared to his last visit a month ago, but reports still having tenderness and he noticed some blood on the toilet paper after wiping a few times this week.   ? ?Vital signs: ?BP 107/70   Pulse 63   Temp 98.5 ?F (36.9 ?C) (Oral)   Wt 128 lb 6.4 oz (58.2 kg)   SpO2 98%   BMI 22.04 kg/m?   ? ?Physical Exam: ?Constitutional:  No acute distress ?Rectal:  External exam reveals continued improvement in the healing tissue.  All wounds are almost healed and only missing a superficial layer.  No raw exposed tissue noted.  However, he is somewhat tender when pushing on the still open components. ? ?Assessment/Plan: ?This is a 46 y.o. male s/p EUA and hemorrhoidectomy ? ?--Discussed with the patient that overall the sites continue to heal.  No evidence of new or growing hemorrhoidal tissue and no evidence of any bleeding at this point.  Recommended that he continue current regimen as he is, and this will continue to heal.  Discussed transitioning away from narcotics and trying to use ibuprofen/tylenol instead.  Will refill Lidocaine ointment to supplement. ?--Follow up as needed. ? ? ?Howie Ill, MD ?[facility]  ?

## 2021-06-15 ENCOUNTER — Encounter: Payer: Self-pay | Admitting: Internal Medicine

## 2021-06-15 ENCOUNTER — Telehealth: Payer: Self-pay | Admitting: Internal Medicine

## 2021-06-15 ENCOUNTER — Ambulatory Visit (INDEPENDENT_AMBULATORY_CARE_PROVIDER_SITE_OTHER): Payer: PRIVATE HEALTH INSURANCE | Admitting: Internal Medicine

## 2021-06-15 ENCOUNTER — Other Ambulatory Visit: Payer: Self-pay

## 2021-06-15 VITALS — BP 106/58 | HR 59 | Temp 97.3°F | Wt 135.0 lb

## 2021-06-15 DIAGNOSIS — S00411A Abrasion of right ear, initial encounter: Secondary | ICD-10-CM

## 2021-06-15 MED ORDER — NEOMYCIN-POLYMYXIN-HC 3.5-10000-1 OT SUSP: 4.0000 [drp] | Freq: Four times a day (QID) | OTIC | 0 refills | Status: AC

## 2021-06-15 MED ORDER — NEOMYCIN-POLYMYXIN-HC 3.5-10000-1 OT SUSP: 4.0000 [drp] | Freq: Four times a day (QID) | OTIC | 0 refills | Status: DC

## 2021-06-15 NOTE — Telephone Encounter (Signed)
Pt just left office and went to get script and was not at pharmacy, issued as print, pt wants to be called in to below: ? ?neomycin-polymyxin-hydrocortisone (CORTISPORIN) 3.5-10000-1 OTIC suspension 10 mL 0 06/15/2021 06/20/2021   ?Sig - Route: Place 4 drops into the right ear 4 (four) times daily for 5 days. X 7 days - Right EAR   ?Class: Print   ? ?[facility] [place], [place] [facility] [place]  ?[address] [place] [address]  ?Phone: [phone] Fax: [phone]  ?Hours: Not open 24 hours  ? ?

## 2021-06-15 NOTE — Progress Notes (Signed)
? ?Subjective:  ? ? Patient ID: Justin Short, male    DOB: [birth year], 46 y.o.   MRN: [id number] ? ?HPI ? ?Patient presents to clinic today with complaint of right ear discomfort. He noticed this a few weeks ago. He reports he was scratching in his ear to get wax out and caused an abrasion that bled and eventually scabbed up. He reports the scab has now come off. He denies ear drainage, ringing in the ear or hearing loss.  He denies headaches, runny nose, nasal congestion, sore throat or cough.  He denies fever, chills or body aches. He has not tried anything OTC for this. ? ?Review of Systems ? ?   ?Past Medical History:  ?Diagnosis Date  ? Anal fissure   ? Anxiety   ? no meds  ? Arthritis   ? Hemorrhoids   ? always present  ? Kidney disease   ? 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 ? ?Current Outpatient Medications  ?Medication Sig Dispense Refill  ? acetaminophen (TYLENOL) 500 MG tablet Take 2 tablets (1,000 mg total) by mouth every 6 (six) hours as needed for mild pain.    ? hydrocortisone (ANUSOL-HC) 2.5 % rectal cream Place 1 application rectally 2 (two) times daily.    ? ibuprofen (ADVIL) 800 MG tablet Take 1 tablet (800 mg total) by mouth every 8 (eight) hours as needed for moderate pain. 60 tablet 1  ? lidocaine (XYLOCAINE) 5 % ointment Apply 1 application. topically 4 (four) times daily as needed for moderate pain or mild pain (apply to perianal area as needed for pain). 35.44 g 0  ? loratadine (CLARITIN) 10 MG tablet Take 10 mg by mouth daily as needed for allergies.    ? Melatonin 5 MG CAPS Take 2.5 mg by mouth at bedtime as needed (sleep).    ? METAMUCIL FIBER PO Take 1-2 capsules by mouth daily as needed (constipation).    ? Multiple Vitamin (MULTIVITAMIN WITH MINERALS) TABS tablet Take 1 tablet by mouth 4 (four) times a week.    ? oxyCODONE (OXY IR/ROXICODONE) 5 MG immediate release tablet Take  1 tablet (5 mg total) by mouth every 4 (four) hours as needed for severe pain. 30 tablet 0  ? phenylephrine (SUDAFED PE) 10 MG TABS tablet Take 10 mg by mouth 2 (two) times daily as needed (congestion).    ? polyethylene glycol (MIRALAX / GLYCOLAX) 17 g packet Take 17 g by mouth daily.    ? ?No current facility-administered medications for this visit.  ? ? ?No Known Allergies ? ?Family History  ?Problem Relation Age of Onset  ? Cancer Mother   ?     skin  ? Diabetes Father   ? Heart disease Father   ?     CHF  ? Cancer Father   ?     renal  ? Stroke Father   ? Kidney disease Father   ?     dialysis s/p kidney surgery ? cancer  ? Thyroid disease Paternal Aunt   ? ? ?Social History  ? ?Socioeconomic History  ? Marital status: Single  ?  Spouse name: Not on file  ? Number of children: Not on file  ? Years of education: Not on file  ? Highest education level: Not on file  ?Occupational History  ? Not on file  ?Tobacco Use  ?  Smoking status: Some Days  ?  Packs/day: 0.50  ?  Years: 22.00  ?  Pack years: 11.00  ?  Types: Cigarettes, Cigars  ?  Start date: 03/28/2020  ? Smokeless tobacco: Never  ?Vaping Use  ? Vaping Use: Every day  ? Substances: Nicotine  ?Substance and Sexual Activity  ? Alcohol use: Yes  ?  Comment: occ  ? Drug use: Yes  ?  Frequency: 10.0 times per week  ?  Types: Marijuana  ?  Comment: smokes small amount each occurrence 2-3 g / week  ? Sexual activity: Yes  ?  Birth control/protection: None  ?  Comment: mutually monogamous male partner (w/ hysterectomy)  ?Other Topics Concern  ? Not on file  ?Social History Narrative  ? Not on file  ? ?Social Determinants of Health  ? ?Financial Resource Strain: Not on file  ?Food Insecurity: Not on file  ?Transportation Needs: Not on file  ?Physical Activity: Not on file  ?Stress: Not on file  ?Social Connections: Not on file  ?Intimate Partner Violence: Not on file  ? ? ? ?Constitutional: Denies fever, malaise, fatigue, headache or abrupt weight changes.  ?HEENT:  Patient reports right ear pain.  Denies eye pain, eye redness, ringing in the ears, wax buildup, runny nose, nasal congestion, bloody nose, or sore throat. ?Respiratory: Denies difficulty breathing, shortness of breath, cough or sputum production.   ?Cardiovascular: Denies chest pain, chest tightness, palpitations or swelling in the hands or feet.  ? ?No other specific complaints in a complete review of systems (except as listed in HPI above). ? ?Objective:  ? Physical Exam ? ?BP (!) 106/58 (BP Location: Right Arm, Patient Position: Sitting, Cuff Size: Normal)   Pulse (!) 59   Temp (!) 97.3 ?F (36.3 ?C) (Temporal)   Wt 135 lb (61.2 kg)   SpO2 99%   BMI 23.17 kg/m?  ? ?Wt Readings from Last 3 Encounters:  ?06/10/21 128 lb 6.4 oz (58.2 kg)  ?05/13/21 131 lb (59.4 kg)  ?04/25/21 132 lb 3.2 oz (60 kg)  ? ? ?General: Appears his stated age, well developed, well nourished in NAD. ?HEENT: Head: normal shape and size; Eyes: sclera white, and EOMs intact; right ears: Tm's gray and intact, normal light reflex, abrasion noted at 6:00-open but not bleeding;  ?Neck: No adenopathy noted. ?Cardiovascular: Bradycardic with normal rhythm. S1,S2 noted.  No murmur, rubs or gallops noted. No JVD or BLE edema. No carotid bruits noted. ?Pulmonary/Chest: Normal effort and positive vesicular breath sounds. No respiratory distress. No wheezes, rales or ronchi noted.  ?Neurological: Alert and oriented.  ? ?BMET ?   ?Component Value Date/Time  ? NA 138 10/12/2020 1611  ? K 4.2 10/12/2020 1611  ? CL 104 10/12/2020 1611  ? CO2 27 10/12/2020 1611  ? GLUCOSE 87 10/12/2020 1611  ? BUN 10 10/12/2020 1611  ? CREATININE 0.93 10/12/2020 1611  ? CALCIUM 9.7 10/12/2020 1611  ? ? ?Lipid Panel  ?   ?Component Value Date/Time  ? CHOL 140 10/12/2020 1611  ? TRIG 47 10/12/2020 1611  ? HDL 54 10/12/2020 1611  ? CHOLHDL 2.6 10/12/2020 1611  ? VLDL 7 09/13/2016 0818  ? LDLCALC 73 10/12/2020 1611  ? ? ?CBC ?   ?Component Value Date/Time  ? WBC 6.0  10/12/2020 1611  ? RBC 4.70 10/12/2020 1611  ? HGB 15.0 10/12/2020 1611  ? HCT 44.9 10/12/2020 1611  ? PLT 295 10/12/2020 1611  ? MCV 95.5 10/12/2020 1611  ? MCH  31.9 10/12/2020 1611  ? MCHC 33.4 10/12/2020 1611  ? RDW 12.0 10/12/2020 1611  ? LYMPHSABS 1,767 09/13/2016 0818  ? MONOABS 627 09/13/2016 0818  ? EOSABS 228 09/13/2016 0818  ? BASOSABS 0 09/13/2016 0818  ? ? ?Hgb A1C ?Lab Results  ?Component Value Date  ? HGBA1C 5.1 10/12/2020  ? ? ? ? ? ? ? ?   ?Assessment & Plan:  ? ?Abrasion, Right External Ear Canal: ? ?Rx for Cortisporin 2 times daily for 5 days ?Avoid putting anything in your external ear canal or scratching with your fingernails ? ? ?RTC in 4 months for your annual exam ?Nicki Reaper, NP ?This visit occurred during the SARS-CoV-2 public health emergency.  Safety protocols were in place, including screening questions prior to the visit, additional usage of staff PPE, and extensive cleaning of exam room while observing appropriate contact time as indicated for disinfecting solutions.  ? ?

## 2021-06-15 NOTE — Telephone Encounter (Signed)
Rx resent to the pharmacy.  Pt advised.  ? ?Thanks,  ? ?-Vernona Rieger  ? ?neomycin-polymyxin-hydrocortisone (CORTISPORIN) 3.5-10000-1 OTIC suspension 10 mL 0 06/15/2021 06/22/2021   ?Sig - Route: Place 4 drops into the right ear 4 (four) times daily for 7 days. X 7 days - Right EAR   ?Sent to pharmacy as: neomycin-polymyxin-hydrocortisone (CORTISPORIN) 3.5-10000-1 OTIC suspension   ?E-Prescribing Status: Receipt confirmed by pharmacy (06/15/2021  4:19 PM EDT)   ? ?Pharmacy ? ?[facility] [address], [place] - [address]  ? ?

## 2021-06-16 ENCOUNTER — Encounter: Payer: Self-pay | Admitting: Internal Medicine

## 2021-06-16 NOTE — Patient Instructions (Signed)
Abrasion An abrasion is a cut or a scrape on your skin. You must take care of your woundso germs do not get in it and cause infection. What are the causes? This condition is caused by rubbing your skin on something or falling on a surface, such as the ground. When your skin rubs on something, some layers ofskin may rub off. What are the signs or symptoms? A cut or a scrape. Bleeding. A red or pink spot. A bruise under your wound. How is this treated? This condition may be treated with: Cleaning your wound. Putting ointment on your wound. Putting a bandage on your wound. Getting a tetanus shot. Follow these instructions at home: Your doctor may tell you to do these things: Medicines Take or use over-the-counter and prescription medicines only as told by your doctor. If you were prescribed an antibiotic medicine, use it as told by your doctor. Do not stop using it even if you start to feel better. Keep your wound clean Clean your wound 1 or 2 times a day or as often told by your doctor. To do this: Wash your hands for at least 20 seconds with mild soap and water. Do this before and after you clean your wound. Wash your wound with mild soap and water. Rinse off the soap. Pat your wound with a clean towel to dry it. Do not rub your wound. Keep your bandage clean and dry. Take it off and change it as told by your doctor. You may have to change your bandage one or more times a day, or as told by your doctor. Watch for signs of infection Check your wound every day for signs of infection. Check for: A red streak that goes away from your wound. Other redness. Swelling or more pain. Warmth. Blood, fluid, pus, or a bad smell. Treat pain and swelling  If told, put ice on the injured area. To do this: Put ice in a plastic bag. Place a towel between your skin and the bag. Leave the ice on for 20 minutes, 2-3 times a day. Take off the ice if your skin turns bright red. This is very  important. If you cannot feel pain, heat, or cold, you have a greater risk of damage to the area. If you can, raise the injured area above the level of your heart while you are sitting or lying down.  General instructions Do not take baths, swim, or use a hot tub. Ask your doctor about taking showers or sponge baths. Keep all follow-up visits. Contact a doctor if: You had a tetanus shot, and you have any of these where the needle went in: Swelling. Very bad pain. Redness. Bleeding. You have a lot of pain, and medicine does not help. You have a fever. You have any of these signs of infection in your wound: Redness, swelling, or more pain. Blood, fluid, pus, or a bad smell. Warmth. Get help right away if: You have a red streak going away from your wound. Summary An abrasion is a cut or a scrape on your skin. Take care of your wound so germs do not get in it. Clean your wound 1 or 2 times a day or as often as told. Change your bandage as told and use medicines as told. Call your doctor if you have a fever or if you have redness, swelling, or more pain in your wound. Call your doctor if you have blood, fluid, pus, or a bad smell in your wound. Get   help right away if you have a red streak going away from your wound. This information is not intended to replace advice given to you by your health care provider. Make sure you discuss any questions you have with your healthcare provider. Document Revised: 06/12/2019 Document Reviewed: 06/12/2019 Elsevier Patient Education  2022 Elsevier Inc.  

## 2021-06-23 ENCOUNTER — Ambulatory Visit (INDEPENDENT_AMBULATORY_CARE_PROVIDER_SITE_OTHER): Payer: No Typology Code available for payment source | Admitting: Physician Assistant

## 2021-06-23 ENCOUNTER — Encounter: Payer: Self-pay | Admitting: Physician Assistant

## 2021-06-23 VITALS — BP 114/79 | HR 86 | Temp 98.1°F | Ht 64.0 in | Wt 132.6 lb

## 2021-06-23 DIAGNOSIS — K648 Other hemorrhoids: Secondary | ICD-10-CM

## 2021-06-23 DIAGNOSIS — Z09 Encounter for follow-up examination after completed treatment for conditions other than malignant neoplasm: Secondary | ICD-10-CM

## 2021-06-23 DIAGNOSIS — K644 Residual hemorrhoidal skin tags: Secondary | ICD-10-CM

## 2021-06-23 MED ORDER — IBUPROFEN 800 MG PO TABS: 800.0000 mg | ORAL_TABLET | Freq: Three times a day (TID) | ORAL | 1 refills | Status: DC | PRN

## 2021-06-23 NOTE — Patient Instructions (Signed)
If you have any concerns or questions, please feel free to call our office.   Surgical Procedures for Hemorrhoids, Care After This sheet gives you information about how to care for yourself after your procedure. Your health care provider may also give you more specific instructions. If you have problems or questions, contact your health care provider. What can I expect after the procedure? After the procedure, it is common to have: Rectal pain. Pain when you are having a bowel movement. Slight rectal bleeding. This is more likely to happen with the first bowel movement after surgery. Follow these instructions at home: Medicines Take over-the-counter and prescription medicines only as told by your health care provider. If you were prescribed an antibiotic medicine, use it as told by your health care provider. Do not stop using the antibiotic even if your condition improves. Ask your health care provider if the medicine prescribed to you requires you to avoid driving or using heavy machinery. Use a stool softener or a bulk laxative as told by your health care provider. Eating and drinking Follow instructions from your health care provider about what to eat or drink after your procedure. You may need to take actions to prevent or treat constipation, such as: Drink enough fluid to keep your urine pale yellow. Take over-the-counter or prescription medicines. Eat foods that are high in fiber, such as beans, whole grains, and fresh fruits and vegetables. Limit foods that are high in fat and processed sugars, such as fried or sweet foods. Activity  Rest as told by your health care provider. Avoid sitting for a long time without moving. Get up to take short walks every 1-2 hours. This is important to improve blood flow and breathing. Ask for help if you feel weak or unsteady. Return to your normal activities as told by your health care provider. Ask your health care provider what activities are safe  for you. Do not lift anything that is heavier than 10 lb (4.5 kg), or the limit that you are told, until your health care provider says that it is safe. Do not strain to have a bowel movement. Do not spend a long time sitting on the toilet. General instructions  Take warm sitz baths for 15-20 minutes, 2-3 times a day to relieve soreness or itching and to keep the rectal area clean. Apply ice packs to the area to reduce swelling and pain. Do not drive for 24 hours if you were given a sedative during your procedure. Keep all follow-up visits as told by your health care provider. This is important. Contact a health care provider if: Your pain medicine is not helping. You have a fever or chills. You have bad smelling drainage. You have a lot of swelling. You become constipated. You have trouble passing urine. Get help right away if: You have very bad rectal pain. You have heavy bleeding from your rectum. Summary After the procedure, it is common to have pain and slight rectal bleeding. Take warm sitz baths for 15-20 minutes, 2-3 times a day to relieve soreness or itching and to keep the rectal area clean. Avoid straining when having a bowel movement. Eat foods that are high in fiber, such as beans, whole grains, and fresh fruits and vegetables. Take over-the-counter and prescription medicines only as told by your health care provider. This information is not intended to replace advice given to you by your health care provider. Make sure you discuss any questions you have with your health care provider. Document   Revised: 09/22/2020 Document Reviewed: 09/22/2020 Elsevier Patient Education  2022 Elsevier Inc.  

## 2021-06-23 NOTE — Progress Notes (Signed)
[facility] ?POST-OP OFFICE VISIT ? ?06/24/2021 ? ?HPI: ?Justin Short is a 46 y.o. male ~2.5 months s/p EUA and hemorrhoidectomy of three columns with Dr Aleen Campi ? ?Since his last visit he believes he has made some gradual improvements. He still has a small area to the inferior portion of the anal verge which is tender with wiping. He is using OTC pain medications and lidocaine gel as well for this. Minimal, if nay scant blood with wiping. He mostly described to me the anus feeling very "tight" especially with bowel movements and passing flatus. He is using Miralax; however, stools have progressed to significant diarrhea. No fever or chills.  ? ?Vital signs: ?BP 114/79   Pulse 86   Temp 98.1 ?F (36.7 ?C) (Oral)   Ht 5\' 4"  (1.626 m)   Wt 132 lb 9.6 oz (60.1 kg)   SpO2 95%   BMI 22.76 kg/m?   ? ?Physical Exam: ?Constitutional: Well appearing male, NAD ?GU: Chaperone present, all hemorrhoidectomy columns are healing well, there is a very small area to the inferior portion of the rectum, 1 mm or less, which still appears to be healing, no erythema, no external hemorrhoids  ? ?Assessment/Plan: ?This is a 46 y.o. male ~2.5 months s/p EUA and hemorrhoidectomy of three columns with Dr 54 ? ? - He has continued to heal well. I suspect he has some degree of circumferential scarring potentially which is the source of his discomfort. He is otherwise without evidence of complication or new hemorrhoids.  ? - Continue current pain regimen ? - I encouraged him to potentially back down on Miralax and add stool softener to avoid diarrhea  ? - he can follow up on as needed basis; He understands to call with questions/concerns ? ?-- ?Aleen Campi, PA-C ?[facility] ?06/24/2021, 10:38 AM ?[phone] ?M-F: 7am - 4pm ? ?

## 2021-06-24 ENCOUNTER — Encounter: Payer: Self-pay | Admitting: Physician Assistant

## 2022-04-28 ENCOUNTER — Encounter: Payer: PRIVATE HEALTH INSURANCE | Admitting: Internal Medicine

## 2022-04-28 ENCOUNTER — Ambulatory Visit: Payer: PRIVATE HEALTH INSURANCE | Admitting: Internal Medicine

## 2022-04-28 ENCOUNTER — Encounter: Payer: Self-pay | Admitting: Internal Medicine

## 2022-04-28 VITALS — BP 118/70 | HR 76 | Ht 64.0 in | Wt 142.5 lb

## 2022-04-28 DIAGNOSIS — Z0001 Encounter for general adult medical examination with abnormal findings: Secondary | ICD-10-CM | POA: Diagnosis not present

## 2022-04-28 DIAGNOSIS — R590 Localized enlarged lymph nodes: Secondary | ICD-10-CM | POA: Diagnosis not present

## 2022-04-28 NOTE — Progress Notes (Signed)
Subjective:    Patient ID: Justin Short, male    DOB: Jan 05, 1976, 47 y.o.   MRN: [id number]  HPI  Patient presents to clinic today for his annual exam.  Flu: 12/2016 Tetanus: 08/2016 COVID: Never Colon screening: Never Vision screening: as needed Dentist: as needed  Diet: He does eat meat. He consumes fruits and veggies. He does eat some fried foods. He drinks mostly juice and water. 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     hydrocortisone (ANUSOL-HC) 2.5 % rectal cream Place 1 application rectally 2 (two) times daily.     ibuprofen (ADVIL) 800 MG tablet Take 1 tablet (800 mg total) by mouth every 8 (eight) hours as needed for moderate pain. 60 tablet 1   lidocaine (XYLOCAINE) 5 % ointment Apply 1 application. topically 4 (four) times daily as needed for moderate pain or mild pain (apply to perianal area as needed for pain). 35.44 g 0   loratadine (CLARITIN) 10 MG tablet Take 10 mg by mouth daily as needed for allergies.     Melatonin 5 MG CAPS Take 2.5 mg by mouth at bedtime as needed (sleep).     METAMUCIL FIBER PO Take 1-2 capsules by mouth daily as needed (constipation).     Multiple Vitamin (MULTIVITAMIN WITH MINERALS) TABS tablet Take 1 tablet by mouth 4 (four) times a week.     oxyCODONE (OXY IR/ROXICODONE) 5 MG immediate release tablet Take 1 tablet (5 mg total) by mouth every 4 (four) hours as needed for severe pain. 30 tablet 0   phenylephrine (SUDAFED PE) 10 MG TABS tablet Take 10 mg by mouth  2 (two) times daily as needed (congestion).     polyethylene glycol (MIRALAX / GLYCOLAX) 17 g packet Take 17 g by mouth daily.     No current facility-administered medications for this visit.    No Known Allergies  Family History  Problem Relation Age of Onset   Cancer Mother        skin   Diabetes Father    Heart disease Father        CHF   Cancer Father        renal   Stroke Father    Kidney disease Father        dialysis s/p kidney surgery ? cancer   Thyroid disease Paternal Aunt     Social History   Socioeconomic History   Marital status: Single    Spouse name: Not on file   Number of children: Not on file   Years of education: Not on file   Highest education level: Not on file  Occupational History   Not on file  Tobacco Use   Smoking status: Some Days    Packs/day: 0.50    Years: 22.00    Total pack years: 11.00    Types: Cigarettes, Cigars    Start date: 03/28/2020  Smokeless tobacco: Never  Vaping Use   Vaping Use: Every day   Substances: Nicotine  Substance and Sexual Activity   Alcohol use: Yes    Comment: occ   Drug use: Yes    Frequency: 10.0 times per week    Types: Marijuana    Comment: smokes small amount each occurrence 2-3 g / week   Sexual activity: Yes    Birth control/protection: None    Comment: mutually monogamous male partner (w/ hysterectomy)  Other Topics Concern   Not on file  Social History Narrative   Not on file   Social Determinants of Health   Financial Resource Strain: Not on file  Food Insecurity: Not on file  Transportation Needs: Not on file  Physical Activity: Not on file  Stress: Not on file  Social Connections: Not on file  Intimate Partner Violence: Not on file     Constitutional: Pt reports fatigue. Denies fever, malaise, headache or abrupt weight changes.  HEENT: Patient reports swollen gland on right side of neck.  Denies eye pain, eye redness, ear pain, ringing in the ears, wax buildup, runny nose,  nasal congestion, bloody nose, or sore throat. Respiratory: Patient reports intermittent cough.  Denies difficulty breathing, shortness of breath, or sputum production.   Cardiovascular: Denies chest pain, chest tightness, palpitations or swelling in the hands or feet.  Gastrointestinal: Denies abdominal pain, bloating, constipation, diarrhea or blood in the stool.  GU: Denies urgency, frequency, pain with urination, burning sensation, blood in urine, odor or discharge. Musculoskeletal: Denies decrease in range of motion, difficulty with gait, muscle pain or joint pain and swelling.  Skin: Denies redness, rashes, lesions or ulcercations.  Neurological: Denies dizziness, difficulty with memory, difficulty with speech or problems with balance and coordination.  Psych: Patient has a history of anxiety and depression.  Denies SI/HI.  No other specific complaints in a complete review of systems (except as listed in HPI above).  Objective:   Physical Exam  BP 118/70   Pulse 76   Ht 5\' 4"  (1.626 m)   Wt 142 lb 8 oz (64.6 kg)   SpO2 97%   BMI 24.46 kg/m   Wt Readings from Last 3 Encounters:  06/23/21 132 lb 9.6 oz (60.1 kg)  06/15/21 135 lb (61.2 kg)  06/10/21 128 lb 6.4 oz (58.2 kg)    General: Appears his stated age, well developed, well nourished in NAD. Skin: Warm, dry and intact. No rashes noted. HEENT: Head: normal shape and size; Eyes: sclera white, no icterus, conjunctiva pink, PERRLA and EOMs intact;  Neck:  Neck supple, trachea midline. No masses, lumps or thyromegaly present.  Cardiovascular: Normal rate and rhythm. S1,S2 noted.  No murmur, rubs or gallops noted. No JVD or BLE edema.  Pulmonary/Chest: Normal effort and positive vesicular breath sounds. No respiratory distress. No wheezes, rales or ronchi noted.  Abdomen:  Normal bowel sounds.  Musculoskeletal: Strength 5/5 BUE/BLE. No difficulty with gait.  Neurological: Alert and oriented. Cranial nerves II-XII grossly  intact. Coordination normal.  Psychiatric: Mood and affect normal. Behavior is normal. Judgment and thought content normal.     BMET    Component Value Date/Time   NA 138 10/12/2020 1611   K 4.2 10/12/2020 1611   CL 104 10/12/2020 1611   CO2 27 10/12/2020 1611   GLUCOSE 87 10/12/2020 1611   BUN 10 10/12/2020 1611   CREATININE 0.93 10/12/2020 1611   CALCIUM 9.7 10/12/2020 1611    Lipid Panel  Component Value Date/Time   CHOL 140 10/12/2020 1611   TRIG 47 10/12/2020 1611   HDL 54 10/12/2020 1611   CHOLHDL 2.6 10/12/2020 1611   VLDL 7 09/13/2016 0818   LDLCALC 73 10/12/2020 1611    CBC    Component Value Date/Time   WBC 6.0 10/12/2020 1611   RBC 4.70 10/12/2020 1611   HGB 15.0 10/12/2020 1611   HCT 44.9 10/12/2020 1611   PLT 295 10/12/2020 1611   MCV 95.5 10/12/2020 1611   MCH 31.9 10/12/2020 1611   MCHC 33.4 10/12/2020 1611   RDW 12.0 10/12/2020 1611   LYMPHSABS 1,767 09/13/2016 0818   MONOABS 627 09/13/2016 0818   EOSABS 228 09/13/2016 0818   BASOSABS 0 09/13/2016 0818    Hgb A1C Lab Results  Component Value Date   HGBA1C 5.1 10/12/2020             Assessment & Plan:   Preventative Health Maintenance:  Encouraged him to get a flu shot in the fall Tetanus UTD Encouraged him to get his COVID-vaccine He declines referral to GI for screening colonoscopy Encouraged him to consume a balanced diet and exercise regimen Advised him to see an eye doctor and dentist annually We will check CBC, c-Met, lipid today  Cervical Adenopathy:  Will check CBC today Exam overall benign No family history of lymphoma  RTC in 1 year, sooner if needed Webb Silversmith, NP

## 2022-04-28 NOTE — Patient Instructions (Signed)
Health Maintenance, Male Adopting a healthy lifestyle and getting preventive care are important in promoting health and wellness. Ask your health care provider about: The right schedule for you to have regular tests and exams. Things you can do on your own to prevent diseases and keep yourself healthy. What should I know about diet, weight, and exercise? Eat a healthy diet  Eat a diet that includes plenty of vegetables, fruits, low-fat dairy products, and lean protein. Do not eat a lot of foods that are high in solid fats, added sugars, or sodium. Maintain a healthy weight Body mass index (BMI) is a measurement that can be used to identify possible weight problems. It estimates body fat based on height and weight. Your health care provider can help determine your BMI and help you achieve or maintain a healthy weight. Get regular exercise Get regular exercise. This is one of the most important things you can do for your health. Most adults should: Exercise for at least 150 minutes each week. The exercise should increase your heart rate and make you sweat (moderate-intensity exercise). Do strengthening exercises at least twice a week. This is in addition to the moderate-intensity exercise. Spend less time sitting. Even light physical activity can be beneficial. Watch cholesterol and blood lipids Have your blood tested for lipids and cholesterol at 46 years of age, then have this test every 5 years. You may need to have your cholesterol levels checked more often if: Your lipid or cholesterol levels are high. You are older than 46 years of age. You are at high risk for heart disease. What should I know about cancer screening? Many types of cancers can be detected early and may often be prevented. Depending on your health history and family history, you may need to have cancer screening at various ages. This may include screening for: Colorectal cancer. Prostate cancer. Skin cancer. Lung  cancer. What should I know about heart disease, diabetes, and high blood pressure? Blood pressure and heart disease High blood pressure causes heart disease and increases the risk of stroke. This is more likely to develop in people who have high blood pressure readings or are overweight. Talk with your health care provider about your target blood pressure readings. Have your blood pressure checked: Every 3-5 years if you are 18-39 years of age. Every year if you are 40 years old or older. If you are between the ages of 65 and 75 and are a current or former smoker, ask your health care provider if you should have a one-time screening for abdominal aortic aneurysm (AAA). Diabetes Have regular diabetes screenings. This checks your fasting blood sugar level. Have the screening done: Once every three years after age 45 if you are at a normal weight and have a low risk for diabetes. More often and at a younger age if you are overweight or have a high risk for diabetes. What should I know about preventing infection? Hepatitis B If you have a higher risk for hepatitis B, you should be screened for this virus. Talk with your health care provider to find out if you are at risk for hepatitis B infection. Hepatitis C Blood testing is recommended for: Everyone born from 1945 through 1965. Anyone with known risk factors for hepatitis C. Sexually transmitted infections (STIs) You should be screened each year for STIs, including gonorrhea and chlamydia, if: You are sexually active and are younger than 46 years of age. You are older than 46 years of age and your   health care provider tells you that you are at risk for this type of infection. Your sexual activity has changed since you were last screened, and you are at increased risk for chlamydia or gonorrhea. Ask your health care provider if you are at risk. Ask your health care provider about whether you are at high risk for HIV. Your health care provider  may recommend a prescription medicine to help prevent HIV infection. If you choose to take medicine to prevent HIV, you should first get tested for HIV. You should then be tested every 3 months for as long as you are taking the medicine. Follow these instructions at home: Alcohol use Do not drink alcohol if your health care provider tells you not to drink. If you drink alcohol: Limit how much you have to 0-2 drinks a day. Know how much alcohol is in your drink. In the [place] one drink equals one 12 oz bottle of beer (355 mL), one 5 oz glass of wine (148 mL), or one 1 oz glass of hard liquor (44 mL). Lifestyle Do not use any products that contain nicotine or tobacco. These products include cigarettes, chewing tobacco, and vaping devices, such as e-cigarettes. If you need help quitting, ask your health care provider. Do not use street drugs. Do not share needles. Ask your health care provider for help if you need support or information about quitting drugs. General instructions Schedule regular health, dental, and eye exams. Stay current with your vaccines. Tell your health care provider if: You often feel depressed. You have ever been abused or do not feel safe at home. Summary Adopting a healthy lifestyle and getting preventive care are important in promoting health and wellness. Follow your health care provider's instructions about healthy diet, exercising, and getting tested or screened for diseases. Follow your health care provider's instructions on monitoring your cholesterol and blood pressure. This information is not intended to replace advice given to you by your health care provider. Make sure you discuss any questions you have with your health care provider. Document Revised: 08/02/2020 Document Reviewed: 08/02/2020 Elsevier Patient Education  2023 [employer] Inc[employer]  

## 2022-04-29 LAB — COMPLETE METABOLIC PANEL WITH GFR
AG Ratio: 1.8 (calc) (ref 1.0–2.5)
ALT: 21 U/L (ref 9–46)
AST: 20 U/L (ref 10–40)
Albumin: 4.2 g/dL (ref 3.6–5.1)
Alkaline phosphatase (APISO): 110 U/L (ref 36–130)
BUN: 9 mg/dL (ref 7–25)
CO2: 28 mmol/L (ref 20–32)
Calcium: 9.4 mg/dL (ref 8.6–10.3)
Chloride: 104 mmol/L (ref 98–110)
Creat: 0.94 mg/dL (ref 0.60–1.29)
Globulin: 2.4 g/dL (calc) (ref 1.9–3.7)
Glucose, Bld: 87 mg/dL (ref 65–99)
Potassium: 4.5 mmol/L (ref 3.5–5.3)
Sodium: 138 mmol/L (ref 135–146)
Total Bilirubin: 0.4 mg/dL (ref 0.2–1.2)
Total Protein: 6.6 g/dL (ref 6.1–8.1)
eGFR: 102 mL/min/{1.73_m2} (ref >=60)

## 2022-04-29 LAB — CBC
HCT: 44.2 % (ref 38.5–50.0)
Hemoglobin: 15.1 g/dL (ref 13.2–17.1)
MCH: 32.1 pg (ref 27.0–33.0)
MCHC: 34.2 g/dL (ref 32.0–36.0)
MCV: 94 fL (ref 80.0–100.0)
MPV: 11.1 fL (ref 7.5–12.5)
Platelets: 247 10*3/uL (ref 140–400)
RBC: 4.7 10*6/uL (ref 4.20–5.80)
RDW: 11.7 % (ref 11.0–15.0)
WBC: 6.7 10*3/uL (ref 3.8–10.8)

## 2022-04-29 LAB — LIPID PANEL
Cholesterol: 151 mg/dL (ref <200)
HDL: 50 mg/dL (ref >=40)
LDL Cholesterol (Calc): 87 mg/dL (calc)
Non-HDL Cholesterol (Calc): 101 mg/dL (calc) (ref <130)
Total CHOL/HDL Ratio: 3 (calc) (ref <5.0)
Triglycerides: 46 mg/dL (ref <150)

## 2022-05-01 ENCOUNTER — Encounter: Payer: Self-pay | Admitting: Internal Medicine

## 2022-08-30 ENCOUNTER — Ambulatory Visit (INDEPENDENT_AMBULATORY_CARE_PROVIDER_SITE_OTHER): Payer: PRIVATE HEALTH INSURANCE | Admitting: Physician Assistant

## 2022-08-30 ENCOUNTER — Ambulatory Visit: Payer: Self-pay

## 2022-08-30 ENCOUNTER — Encounter: Payer: Self-pay | Admitting: Physician Assistant

## 2022-08-30 VITALS — BP 116/64 | HR 83 | Resp 16 | Ht 64.0 in | Wt 142.0 lb

## 2022-08-30 DIAGNOSIS — S80261A Insect bite (nonvenomous), right knee, initial encounter: Secondary | ICD-10-CM | POA: Diagnosis not present

## 2022-08-30 DIAGNOSIS — R590 Localized enlarged lymph nodes: Secondary | ICD-10-CM

## 2022-08-30 DIAGNOSIS — W57XXXA Bitten or stung by nonvenomous insect and other nonvenomous arthropods, initial encounter: Secondary | ICD-10-CM

## 2022-08-30 MED ORDER — DOXYCYCLINE HYCLATE 100 MG PO TABS: 100.0000 mg | ORAL_TABLET | Freq: Two times a day (BID) | ORAL | 0 refills | Status: AC

## 2022-08-30 NOTE — Progress Notes (Signed)
Acute Office Visit   Patient: Justin Short   DOB: 11-09-1975   47 y.o. Male  MRN: [id number] Visit Date: 08/30/2022  Today's healthcare provider: Oswaldo Conroy Zelta Enfield, PA-C  Introduced myself to the patient as a Secondary school teacher and provided education on APPs in clinical practice.    Chief Complaint  Patient presents with   Adenopathy    R side of neck   Tick Removal    Removed 10 days ago, under R knee   Subjective    HPI HPI     Adenopathy    Additional comments: R side of neck        Tick Removal    Additional comments: Removed 10 days ago, under R knee      Last edited by Dollene Primrose, CMA on 08/30/2022 11:35 AM.        He reports some swollen lymph nodes on the right side of his neck  He reports noticing this for the first time last night He states he has had some drainage from his right ear for the past [redacted] weeks along with some allergy symptoms  He reports he removed a tick from his right leg about 10 days ago  He reports the tick was still flat and not engorged- thinks it was attached about an hour  He thinks he removed the full tick  He states the area around the bite was swollen for a few days following removal   He denies rashes,  He reports some body aches and joint pains over the last few days that seem to be outside his normal    Medications: Outpatient Medications Prior to Visit  Medication Sig   acetaminophen (TYLENOL) 500 MG tablet Take 2 tablets (1,000 mg total) by mouth every 6 (six) hours as needed for mild pain.   hydrocortisone (ANUSOL-HC) 2.5 % rectal cream Place 1 application rectally 2 (two) times daily.   ibuprofen (ADVIL) 800 MG tablet Take 1 tablet (800 mg total) by mouth every 8 (eight) hours as needed for moderate pain.   loratadine (CLARITIN) 10 MG tablet Take 10 mg by mouth daily as needed for allergies.   Melatonin 5 MG CAPS Take 2.5 mg by mouth at bedtime as needed (sleep).   METAMUCIL FIBER PO Take 1-2 capsules by mouth daily as  needed (constipation).   Multiple Vitamin (MULTIVITAMIN WITH MINERALS) TABS tablet Take 1 tablet by mouth 4 (four) times a week.   polyethylene glycol (MIRALAX / GLYCOLAX) 17 g packet Take 17 g by mouth daily.   No facility-administered medications prior to visit.    Review of Systems  Constitutional:  Positive for fatigue. Negative for chills and fever.  HENT:  Positive for ear discharge (right ear for the past few weeks).   Musculoskeletal:  Positive for arthralgias and myalgias.  Hematological:  Positive for adenopathy (right neck).       Objective    BP 116/64   Pulse 83   Resp 16   Ht 5\' 4"  (1.626 m)   Wt 142 lb (64.4 kg)   SpO2 97%   BMI 24.37 kg/m    Physical Exam Vitals reviewed.  Constitutional:      General: He is awake.     Appearance: Normal appearance. He is well-developed and well-groomed.  HENT:     Head: Normocephalic.     Right Ear: Hearing, tympanic membrane and ear canal normal. No middle ear effusion. Tympanic membrane is not  injected, scarred, perforated, erythematous, retracted or bulging.     Left Ear: Hearing, tympanic membrane and ear canal normal.  No middle ear effusion. Tympanic membrane is not injected, scarred, perforated, erythematous, retracted or bulging.     Mouth/Throat:     Lips: Pink.     Mouth: Mucous membranes are moist.     Pharynx: Uvula midline. No oropharyngeal exudate or posterior oropharyngeal erythema.  Eyes:     General: Lids are normal. Gaze aligned appropriately.     Extraocular Movements: Extraocular movements intact.     Conjunctiva/sclera: Conjunctivae normal.  Neck:   Pulmonary:     Effort: Pulmonary effort is normal.     Breath sounds: Normal breath sounds.  Lymphadenopathy:     Head:     Right side of head: Submandibular and tonsillar adenopathy present.     Cervical: Cervical adenopathy present.     Right cervical: Superficial cervical adenopathy present.     Left cervical: No superficial cervical  adenopathy.  Skin:    General: Skin is warm and dry.     Findings: Lesion present.     Comments: Red lesion along posterior right knee with central excoriation. No evidence of foreign body remaining   Neurological:     Mental Status: He is alert.  Psychiatric:        Behavior: Behavior is cooperative.       No results found for any visits on 08/30/22.  Assessment & Plan      No follow-ups on file.       Problem List Items Addressed This Visit   None Visit Diagnoses     Tick bite of right knee, initial encounter    -  Primary Acute, new concern Reports removing a tick from right posterior knee about 10 days ago and now states he has new myalgias, arthralgias, cervical lymph node swelling and fatigue Will check tick born illnesses- RMSF, Lyme and Ehrlichia antibody panels for rule out  Given symptoms, will go ahead and start Doxycycline 100 mg PO BID x 10 days for coverage Reviewed side effects and warnings associated with abx use Follow up as needed for persistent or progressing symptoms     Relevant Medications   doxycycline (VIBRA-TABS) 100 MG tablet   Other Relevant Orders   B. burgdorfi antibodies   Rocky mtn spotted fvr abs pnl(IgG+IgM)   Ehrlichia Antibody Panel   Cervical adenopathy     Reports newly noticed cervical adenopathy of tonsillar and submandibular lymph nodes on right side  Lymph nodes are tender, slightly mobile, and seem to be <1 cm in size Unsure if this is reaction to potential tick born illness or ear etiology  Recommend monitoring for the next 4 weeks - if not improving or progressing, recommend [place] of area for further evaluation  Follow up as needed for persistent or progressing symptoms     Relevant Orders   COMPLETE METABOLIC PANEL WITH GFR   CBC w/Diff/Platelet        No follow-ups on file.   I, Gionna Polak E Castulo Scarpelli, PA-C, have reviewed all documentation for this visit. The documentation on 08/30/22 for the exam, diagnosis, procedures, and  orders are all accurate and complete.   Jacquelin Hawking, MHS, PA-C [facility]

## 2022-08-30 NOTE — Telephone Encounter (Signed)
Message from Justin Short sent at 08/30/2022 10:52 AM EDT  Summary: swelling   Pt states that starting last night he has swelling under his ear and knots in his lymph nodes that have some pain. Please advise.         Chief Complaint: swollen lymph nodes Symptoms: hard and tender to touch, ear ache, also has a tick bite near right knee that pt has scratched scab off 5 times. Bit last week  Frequency: sneezing, earache Pertinent Negatives: Patient denies rash,fever Disposition: [] ED /[] Urgent Care (no appt availability in office) / [x] Appointment(In office/virtual)/ []  [facility]/ [] Home Care/ [] Refused Recommended Disposition /[] [facility]/ []  Follow-up with PCP Additional Notes: no appts today with PCP- pt going to beach tomorrow and wanted to be seen today. Made appt with Denny Peon Mecum PA at [facility] this am. Address provided.   Reason for Disposition  [1] Very tender to the touch AND [2] no fever  Answer Assessment - Initial Assessment Questions 1. LOCATION: "Where is the swollen node located?" "Is the matching node on the other side of the body also swollen?"      Under ear and right side of neck and hard  came up suddenly last night - ear ache 2. SIZE: "How big is the node?" (e.g., inches or centimeters; or compared to common objects such as pea, bean, marble, golf ball)      Pea size 3. ONSET: "When did the swelling start?"      Last night 4. NECK NODES: "Is there a sore throat, runny nose or other symptoms of a cold?"      Allergies sneezing 5. GROIN OR ARMPIT NODES: "Is there a sore, scratch, cut or painful red area on that arm or leg?"      Bit by tick on right leg redness last Sunday was scabbed over 5 times but pt scratched off 6. FEVER: "Do you have a fever?" If Yes, ask: "What is it, how was it measured, and when did it start?"      Fatigue  7. CAUSE: "What do you think is causing the swollen lymph nodes?"     ear 8. OTHER SYMPTOMS: "Do you  have any other symptoms?"     Sneezing  9. PREGNANCY: "Is there any chance you are pregnant?" "When was your last menstrual period?"     N/a  Protocols used: Lymph Nodes - Swollen-A-AH

## 2022-08-31 NOTE — Telephone Encounter (Signed)
noted 

## 2022-09-06 LAB — CBC WITH DIFFERENTIAL/PLATELET
Absolute Monocytes: 534 cells/uL (ref 200–950)
Basophils Absolute: 30 cells/uL (ref 0–200)
Basophils Relative: 0.5 %
Eosinophils Absolute: 138 cells/uL (ref 15–500)
Eosinophils Relative: 2.3 %
HCT: 44.2 % (ref 38.5–50.0)
Hemoglobin: 15 g/dL (ref 13.2–17.1)
Lymphs Abs: 1842 cells/uL (ref 850–3900)
MCH: 32.5 pg (ref 27.0–33.0)
MCHC: 33.9 g/dL (ref 32.0–36.0)
MCV: 95.7 fL (ref 80.0–100.0)
MPV: 11.2 fL (ref 7.5–12.5)
Monocytes Relative: 8.9 %
Neutro Abs: 3456 cells/uL (ref 1500–7800)
Neutrophils Relative %: 57.6 %
Platelets: 280 10*3/uL (ref 140–400)
RBC: 4.62 10*6/uL (ref 4.20–5.80)
RDW: 12 % (ref 11.0–15.0)
Total Lymphocyte: 30.7 %
WBC: 6 10*3/uL (ref 3.8–10.8)

## 2022-09-06 LAB — COMPLETE METABOLIC PANEL WITH GFR
AG Ratio: 1.8 (calc) (ref 1.0–2.5)
ALT: 20 U/L (ref 9–46)
AST: 19 U/L (ref 10–40)
Albumin: 4.4 g/dL (ref 3.6–5.1)
Alkaline phosphatase (APISO): 110 U/L (ref 36–130)
BUN: 8 mg/dL (ref 7–25)
CO2: 26 mmol/L (ref 20–32)
Calcium: 9.2 mg/dL (ref 8.6–10.3)
Chloride: 104 mmol/L (ref 98–110)
Creat: 0.85 mg/dL (ref 0.60–1.29)
Globulin: 2.5 g/dL (calc) (ref 1.9–3.7)
Glucose, Bld: 89 mg/dL (ref 65–99)
Potassium: 4.4 mmol/L (ref 3.5–5.3)
Sodium: 140 mmol/L (ref 135–146)
Total Bilirubin: 0.3 mg/dL (ref 0.2–1.2)
Total Protein: 6.9 g/dL (ref 6.1–8.1)
eGFR: 109 mL/min/{1.73_m2} (ref >=60)

## 2022-09-06 LAB — ROCKY MTN SPOTTED FVR ABS PNL(IGG+IGM)
RMSF IgG: NOT DETECTED
RMSF IgM: NOT DETECTED

## 2022-09-06 LAB — EHRLICHIA ANTIBODY PANEL
E. CHAFFEENSIS AB IGG: <1:64 {titer}
E. CHAFFEENSIS AB IGM: <1:20 {titer}

## 2022-09-06 LAB — B. BURGDORFI ANTIBODIES: B burgdorferi Ab IgG+IgM: <0.9 index

## 2022-09-06 NOTE — Progress Notes (Signed)
Your testing was negative for Lyme disease and rocky mountain spotted fever. We are still waiting on the Ehrlichia results.  Your electrolytes, liver and kidney function were normal.  Your blood count was normal - no signs of anemia or white blood cell elevation

## 2022-09-08 NOTE — Progress Notes (Signed)
Your Ehrlichia results were negative. Please let us know if you have further concerns.

## 2022-11-02 ENCOUNTER — Telehealth (INDEPENDENT_AMBULATORY_CARE_PROVIDER_SITE_OTHER): Payer: 59 | Admitting: Internal Medicine

## 2022-11-02 ENCOUNTER — Ambulatory Visit: Payer: 59 | Admitting: Internal Medicine

## 2022-11-02 ENCOUNTER — Encounter: Payer: Self-pay | Admitting: Internal Medicine

## 2022-11-02 DIAGNOSIS — R1909 Other intra-abdominal and pelvic swelling, mass and lump: Secondary | ICD-10-CM | POA: Diagnosis not present

## 2022-11-02 DIAGNOSIS — R1031 Right lower quadrant pain: Secondary | ICD-10-CM

## 2022-11-02 NOTE — Progress Notes (Signed)
Virtual Visit via Video Note  I connected with Justin Short on 11/02/22 at  1:40 PM EDT by a video enabled telemedicine application and verified that I am speaking with the correct person using two identifiers.  Location: Patient: In his car Provider: Home  Person's participating in this video call: Justin Reaper, NP and Doreene Nest.   I discussed the limitations of evaluation and management by telemedicine and the availability of in person appointments. The patient expressed understanding and agreed to proceed.  History of Present Illness:  Patient reports abdominal mass and RLQ abdominal discomfort.  He noticed this a couple of months ago but it seems to have gotten worse in the last few weeks.He reports the RLQ pain is intermittent. It seems worse with singing, coughing or lifting. The pain does not radiate. He feels like his belly button is protruding. His bowels are moving normally. He has been on mirilax since his hemorrhoid surgery 03/2021. He denies urinary symptoms. He denies nausea, vomiting, reflux, constipation, diarrhea or blood in his stool. He reports history of inguinal hernia repair as an infant.   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     hydrocortisone (ANUSOL-HC) 2.5 % rectal cream Place 1 application rectally 2 (two) times daily.     ibuprofen (ADVIL) 800 MG tablet Take 1 tablet (800 mg total) by mouth every 8 (eight) hours as needed for moderate pain. 60 tablet 1   loratadine (CLARITIN) 10 MG tablet Take 10 mg by mouth daily as  needed for allergies.     Melatonin 5 MG CAPS Take 2.5 mg by mouth at bedtime as needed (sleep).     METAMUCIL FIBER PO Take 1-2 capsules by mouth daily as needed (constipation).     Multiple Vitamin (MULTIVITAMIN WITH MINERALS) TABS tablet Take 1 tablet by mouth 4 (four) times a week.     polyethylene glycol (MIRALAX / GLYCOLAX) 17 g packet Take 17 g by mouth daily.     No current facility-administered medications for this visit.    No Known Allergies  Family History  Problem Relation Age of Onset   Cancer Mother        skin   Diabetes Father    Heart disease Father        CHF   Cancer Father        renal   Stroke Father    Kidney disease Father        dialysis s/p kidney surgery ? cancer   Thyroid disease Paternal Aunt     Social History   Socioeconomic History   Marital status: Single    Spouse name: Not on file   Number of children: Not on file   Years of education: Not on file   Highest education level: Not on file  Occupational History   Not on file  Tobacco Use   Smoking status: Some Days    Current packs/day: 0.50    Average packs/day:  0.5 packs/day for 22.0 years (11.0 ttl pk-yrs)    Types: Cigarettes, Cigars    Start date: 03/28/2020   Smokeless tobacco: Never  Vaping Use   Vaping status: Every Day   Substances: Nicotine  Substance and Sexual Activity   Alcohol use: Yes    Comment: occ   Drug use: Yes    Frequency: 10.0 times per week    Types: Marijuana    Comment: smokes small amount each occurrence 2-3 g / week   Sexual activity: Yes    Birth control/protection: None    Comment: mutually monogamous male partner (w/ hysterectomy)  Other Topics Concern   Not on file  Social History Narrative   Not on file   Social Determinants of Health   Financial Resource Strain: Not on file  Food Insecurity: Not on file  Transportation Needs: Not on file  Physical Activity: Not on file  Stress: Not on file  Social Connections: Not on file  Intimate  Partner Violence: Not on file     Constitutional: Denies fever, malaise, fatigue, headache or abrupt weight changes.  HEENT: Denies eye pain, eye redness, ear pain, ringing in the ears, wax buildup, runny nose, nasal congestion, bloody nose, or sore throat. Respiratory: Denies difficulty breathing, shortness of breath, cough or sputum production.   Cardiovascular: Denies chest pain, chest tightness, palpitations or swelling in the hands or feet.  Gastrointestinal: Pt reports RLQ abdominal pain, mass at umbilicus. Denies bloating, constipation, diarrhea or blood in the stool.  GU: Denies urgency, frequency, pain with urination, burning sensation, blood in urine, odor or discharge. Musculoskeletal: Denies decrease in range of motion, difficulty with gait, muscle pain or joint pain and swelling.  Skin: Denies redness, rashes, lesions or ulcercations.  Neurological: Denies dizziness, difficulty with memory, difficulty with speech or problems with balance and coordination.  Psych: Patient has a history of anxiety and depression.  Denies SI/HI.  No other specific complaints in a complete review of systems (except as listed in HPI above).    Observations/Objective:   Wt Readings from Last 3 Encounters:  08/30/22 142 lb (64.4 kg)  04/28/22 142 lb 8 oz (64.6 kg)  06/23/21 132 lb 9.6 oz (60.1 kg)    General: Appears his stated age, well developed, well nourished in NAD. Pulmonary/Chest: Normal effort. No respiratory distress.  Abdomen: He points to RLQ as his site of discomfort. Slight bulging noted of the umbilicus. Neurological: Alert and oriented.   BMET    Component Value Date/Time   NA 140 08/30/2022 1159   K 4.4 08/30/2022 1159   CL 104 08/30/2022 1159   CO2 26 08/30/2022 1159   GLUCOSE 89 08/30/2022 1159   BUN 8 08/30/2022 1159   CREATININE 0.85 08/30/2022 1159   CALCIUM 9.2 08/30/2022 1159    Lipid Panel     Component Value Date/Time   CHOL 151 04/28/2022 1052   TRIG 46  04/28/2022 1052   HDL 50 04/28/2022 1052   CHOLHDL 3.0 04/28/2022 1052   VLDL 7 09/13/2016 0818   LDLCALC 87 04/28/2022 1052    CBC    Component Value Date/Time   WBC 6.0 08/30/2022 1159   RBC 4.62 08/30/2022 1159   HGB 15.0 08/30/2022 1159   HCT 44.2 08/30/2022 1159   PLT 280 08/30/2022 1159   MCV 95.7 08/30/2022 1159   MCH 32.5 08/30/2022 1159   MCHC 33.9 08/30/2022 1159   RDW 12.0 08/30/2022 1159   LYMPHSABS 1,842 08/30/2022 1159   MONOABS 627  09/13/2016 0818   EOSABS 138 08/30/2022 1159   BASOSABS 30 08/30/2022 1159    Hgb A1C Lab Results  Component Value Date   HGBA1C 5.1 10/12/2020        Assessment and Plan:  RLQ abdominal pain, mass of umbilicus:  Will obtain CT abd/pelvis instead of abdominal ultrasound as he is having this RLQ discomfort that may or may not be related to possible umbilical hernia Avoid heavy lifting or anything that causes abdominal straining until further notice. He will be called with imaging results and recommended treatment plan which would include referral to general surgeon if umbilical hernia present  RTC in 6 months for your annual exam  Follow Up Instructions:    I discussed the assessment and treatment plan with the patient. The patient was provided an opportunity to ask questions and all were answered. The patient agreed with the plan and demonstrated an understanding of the instructions.   The patient was advised to call back or seek an in-person evaluation if the symptoms worsen or if the condition fails to improve as anticipated.    Justin Reaper, NP

## 2022-11-02 NOTE — Patient Instructions (Signed)
Umbilical Hernia, Adult  A hernia is a lump of tissue that pushes through an opening in the muscles. An umbilical hernia happens in the belly, near the belly button. The hernia may contain tissues from the small or large intestine. It may also have fatty tissue that covers the intestines. Umbilical hernias in adults may get worse over time. They need to be treated with surgery. There are several types of umbilical hernias. They include: Indirect hernia. This occurs just above or below the belly button. It's the most common type of umbilical hernia in adults. Direct hernia. This type occurs in an opening that's formed by the belly button. Reducible hernia. This hernia comes and goes. You may see it only when you strain, cough, or lift something heavy. This type of hernia can be pushed back into the belly (reduced). Incarcerated hernia. This traps the hernia in the wall of the belly. This type of hernia can't be pushed back into the belly. It can cause a strangulated hernia. Strangulated hernia. This hernia cuts off blood flow to the tissues inside the hernia. The tissues can die if this happens. This type of hernia must be treated right away. What are the causes? An umbilical hernia happens when tissue inside the belly pushes through an opening in the muscles of the belly. What increases the risk? You're more likely to get this hernia if: You strain while lifting or pushing heavy objects. You've had several pregnancies. You have a condition that puts pressure on your belly, and you've had it for a long time. These include: Obesity. A buildup of fluid inside your belly. Vomiting or coughing all the time. Trouble pooping (constipation). You've had surgery that weakened the muscles in the belly. What are the signs or symptoms? The main symptom of this condition is a bulge at the belly button or near it. The bulge does not cause pain. Other symptoms depend on the type of hernia you have. A  reducible hernia may be seen only when you strain, cough, or lift something heavy. Other symptoms may include: Dull pain. A feeling of pressure. An incarcerated hernia may cause very bad pain. Also, you may: Vomit or feel like you may vomit. Not be able to pass gas. A strangulated hernia may cause: Pain that gets worse and worse. Vomiting, or feeling like you may vomit. Pain when you press on the hernia. Change of color on the skin over the hernia. The skin may become red or purple. Trouble pooping. Blood in the poop. How is this diagnosed? This condition may be diagnosed based on: Your symptoms and medical history. A physical exam. You may be asked to cough or strain while standing. These actions will put pressure inside your belly. The pressure can force the hernia through the opening in your muscles. Your health care provider may try to push the hernia back into your belly (reduce). How is this treated? Surgery is the only treatment for an umbilical hernia. Surgery for a strangulated hernia must be done right away. If you have a small hernia that's not incarcerated, you may need to lose weight before the surgery is done. Follow these instructions at home: Managing constipation You may need to take these actions to prevent trouble pooping. This will help to prevent straining. Drink enough fluid to keep your pee (urine) pale yellow. Take over-the-counter or prescription medicines. Eat foods that are high in fiber, such as beans, whole grains, and fresh fruits and vegetables. Limit foods that are high in  fat and sugars, such as fried or sweet foods. General instructions Do not try to push the hernia back in. Lose weight, if told by your provider. Watch your hernia for any changes in color or size. Tell your provider if any changes occur. You may need to avoid activities that put pressure on your hernia. You may have to avoid lifting. Ask your provider how much you can safely  lift. Take over-the-counter and prescription medicines only as told by your provider. Contact a health care provider if: Your hernia gets larger or feels hard. Your hernia becomes painful. You get a fever or chills. Get help right away if: You get very bad pain near the area of the hernia, and the pain comes on suddenly. You have pain and you vomit or feel like you may vomit. The skin over your hernia changes color. These symptoms may be an emergency. Get help right away. Call 911. Do not wait to see if the symptoms go away. Do not drive yourself to the hospital. This information is not intended to replace advice given to you by your health care provider. Make sure you discuss any questions you have with your health care provider. Document Revised: 07/04/2022 Document Reviewed: 07/04/2022 Elsevier Patient Education  2024 [employer].

## 2022-11-03 ENCOUNTER — Encounter: Payer: Self-pay | Admitting: Internal Medicine

## 2022-11-03 ENCOUNTER — Ambulatory Visit: Admission: RE | Admit: 2022-11-03 | Payer: No Typology Code available for payment source | Source: Ambulatory Visit

## 2022-11-03 ENCOUNTER — Telehealth: Payer: Self-pay

## 2022-11-03 NOTE — Telephone Encounter (Signed)
Mychart message sent.

## 2022-11-03 NOTE — Telephone Encounter (Signed)
Copied from CRM [id number]. Topic: General - Inquiry >> Nov 03, 2022  8:29 AM De Blanch wrote: Reason for CRM: Pt stated he would like to discuss other options with PCP because CT abd/pelvis is $1,500.00 and this is something he cannot pay for right now. Asked for a response to be sent via MyChart because he is at work and will miss a callback.   Please advise.

## 2022-11-06 ENCOUNTER — Ambulatory Visit: Payer: PRIVATE HEALTH INSURANCE | Admitting: Internal Medicine

## 2022-11-10 ENCOUNTER — Encounter: Payer: Self-pay | Admitting: Family Medicine

## 2022-11-10 ENCOUNTER — Ambulatory Visit: Payer: 59 | Admitting: Family Medicine

## 2022-11-10 VITALS — BP 117/69 | HR 70 | Temp 96.9°F | Ht 64.0 in | Wt 142.8 lb

## 2022-11-10 DIAGNOSIS — H60541 Acute eczematoid otitis externa, right ear: Secondary | ICD-10-CM

## 2022-11-10 DIAGNOSIS — R591 Generalized enlarged lymph nodes: Secondary | ICD-10-CM

## 2022-11-10 DIAGNOSIS — J358 Other chronic diseases of tonsils and adenoids: Secondary | ICD-10-CM

## 2022-11-10 DIAGNOSIS — R140 Abdominal distension (gaseous): Secondary | ICD-10-CM | POA: Diagnosis not present

## 2022-11-10 DIAGNOSIS — H6591 Unspecified nonsuppurative otitis media, right ear: Secondary | ICD-10-CM

## 2022-11-10 DIAGNOSIS — K429 Umbilical hernia without obstruction or gangrene: Secondary | ICD-10-CM | POA: Diagnosis not present

## 2022-11-10 DIAGNOSIS — J329 Chronic sinusitis, unspecified: Secondary | ICD-10-CM

## 2022-11-10 MED ORDER — FLUTICASONE PROPIONATE 50 MCG/ACT NA SUSP: 2.0000 | Freq: Every day | NASAL | 3 refills | Status: DC

## 2022-11-10 MED ORDER — HYDROCORTISONE-ACETIC ACID 1-2 % OT SOLN: 3.0000 [drp] | Freq: Three times a day (TID) | OTIC | 0 refills | Status: DC

## 2022-11-10 MED ORDER — AMOXICILLIN-POT CLAVULANATE 875-125 MG PO TABS: 1.0000 | ORAL_TABLET | Freq: Two times a day (BID) | ORAL | 0 refills | Status: DC

## 2022-11-10 NOTE — Progress Notes (Signed)
Subjective:    Patient ID: Justin Short, male    DOB: [birth year], 47 y.o.   MRN: [id number]  Justin Short is a 47 y.o. male presenting on 11/10/2022 for Mass (Right side of neck and another mass underneath right ear onset for weeks. Pt states it is uncomfortable when moving head from side to side)  Patient presents for a same day appointment.  PCP Nicki Reaper, FNP  HPI  Right Lymphadenopathy, submandibular / angle of jaw / neck Right Ear problem Reports several weeks with swollen and uncomfortable lymph node swelling knots R neck and angle of jaw. Admits some R ear issue feels like scab or wax Admits occasional headache Denies any fever chills cough sore throat sinus congestion  Umbilical Hernia History of hemorrhoids Reports worse pressure with umbilical hernia, identified worsening recently past few weeks. Worse with inc pressure singing coughing etc, and activity lifting. No active worsening herniation. He takes Miralax half dose most days for 1-2 years, he has reduced Admits bilateral lower abdominal bloating      11/10/2022   11:04 AM 08/30/2022   11:30 AM 04/28/2022   10:50 AM  Depression screen PHQ 2/9  Decreased Interest 1 0 2  Down, Depressed, Hopeless 1 0 2  PHQ - 2 Score 2 0 4  Altered sleeping 1 0 2  Tired, decreased energy 1 0 2  Change in appetite 0 0 0  Feeling bad or failure about yourself  0 0 0  Trouble concentrating 0 0 1  Moving slowly or fidgety/restless 0 0 0  Suicidal thoughts 0 0 0  PHQ-9 Score 4 0 9  Difficult doing work/chores Somewhat difficult  Not difficult at all    Social History   Tobacco Use   Smoking status: Some Days    Current packs/day: 0.50    Average packs/day: 0.5 packs/day for 22.0 years (11.0 ttl pk-yrs)    Types: Cigarettes, Cigars    Start date: 03/28/2020   Smokeless tobacco: Never  Vaping Use   Vaping status: Every Day   Substances: Nicotine  Substance Use Topics   Alcohol use: Yes    Comment: occ   Drug  use: Yes    Frequency: 10.0 times per week    Types: Marijuana    Comment: smokes small amount each occurrence 2-3 g / week    Review of Systems Per HPI unless specifically indicated above     Objective:    BP 117/69   Pulse 70   Temp (!) 96.9 F (36.1 C) (Temporal)   Ht 5\' 4"  (1.626 m)   Wt 142 lb 12.8 oz (64.8 kg)   SpO2 97%   BMI 24.51 kg/m   Wt Readings from Last 3 Encounters:  11/10/22 142 lb 12.8 oz (64.8 kg)  08/30/22 142 lb (64.4 kg)  04/28/22 142 lb 8 oz (64.6 kg)    Physical Exam Vitals and nursing note reviewed.  Constitutional:      General: He is not in acute distress.    Appearance: He is well-developed. He is not diaphoretic.     Comments: Well-appearing, comfortable, cooperative  HENT:     Head: Normocephalic and atraumatic.     Right Ear: There is no impacted cerumen.     Left Ear: There is no impacted cerumen.     Ears:     Comments: R TM with effusion and abnormality, no erythema. Some debris within ear canal, no ulceration. Flaky skin seems some eczema or inflammation of  external ear canal    Mouth/Throat:     Comments: Tonsilliths bilateral, some edema of tonsils Eyes:     General:        Right eye: No discharge.        Left eye: No discharge.     Conjunctiva/sclera: Conjunctivae normal.  Neck:     Thyroid: No thyromegaly.  Cardiovascular:     Rate and Rhythm: Normal rate and regular rhythm.     Pulses: Normal pulses.     Heart sounds: Normal heart sounds. No murmur heard. Pulmonary:     Effort: Pulmonary effort is normal. No respiratory distress.     Breath sounds: Normal breath sounds. No wheezing or rales.  Abdominal:     General: Bowel sounds are normal. There is distension.     Palpations: There is no mass.     Tenderness: There is no abdominal tenderness. There is no guarding.     Hernia: A hernia (umbilical hernia moderate sized, limited active herniation on exam) is present.  Musculoskeletal:        General: Normal range of  motion.     Cervical back: Normal range of motion and neck supple.     Right lower leg: No edema.     Left lower leg: No edema.  Lymphadenopathy:     Cervical: Cervical adenopathy (R LAD submandibular angle of jaw and R anterior cervical localized LAD) present.  Skin:    General: Skin is warm and dry.     Findings: No erythema or rash.  Neurological:     Mental Status: He is alert and oriented to person, place, and time. Mental status is at baseline.  Psychiatric:        Behavior: Behavior normal.     Comments: Well groomed, good eye contact, normal speech and thoughts    Results for orders placed or performed in visit on 08/30/22  B. burgdorfi antibodies  Result Value Ref Range   B burgdorferi Ab IgG+IgM <0.90 index  Rocky mtn spotted fvr abs pnl(IgG+IgM)  Result Value Ref Range   RMSF IgG Not Detected Not Detected   RMSF IgM Not Detected Not Detected  Ehrlichia Antibody Panel  Result Value Ref Range   E. CHAFFEENSIS AB IGG <1:64    E. CHAFFEENSIS AB IGM <1:20    INTERPRETATION    COMPLETE METABOLIC PANEL WITH GFR  Result Value Ref Range   Glucose, Bld 89 65 - 99 mg/dL   BUN 8 7 - 25 mg/dL   Creat 4.78 2.95 - 6.21 mg/dL   eGFR 308 > OR = 60 MV/HQI/6.96E9   BUN/Creatinine Ratio SEE NOTE: 6 - 22 (calc)   Sodium 140 135 - 146 mmol/L   Potassium 4.4 3.5 - 5.3 mmol/L   Chloride 104 98 - 110 mmol/L   CO2 26 20 - 32 mmol/L   Calcium 9.2 8.6 - 10.3 mg/dL   Total Protein 6.9 6.1 - 8.1 g/dL   Albumin 4.4 3.6 - 5.1 g/dL   Globulin 2.5 1.9 - 3.7 g/dL (calc)   AG Ratio 1.8 1.0 - 2.5 (calc)   Total Bilirubin 0.3 0.2 - 1.2 mg/dL   Alkaline phosphatase (APISO) 110 36 - 130 U/L   AST 19 10 - 40 U/L   ALT 20 9 - 46 U/L  CBC w/Diff/Platelet  Result Value Ref Range   WBC 6.0 3.8 - 10.8 Thousand/uL   RBC 4.62 4.20 - 5.80 Million/uL   Hemoglobin 15.0 13.2 - 17.1 g/dL  HCT 44.2 38.5 - 50.0 %   MCV 95.7 80.0 - 100.0 fL   MCH 32.5 27.0 - 33.0 pg   MCHC 33.9 32.0 - 36.0 g/dL   RDW  41.3 24.4 - 01.0 %   Platelets 280 140 - 400 Thousand/uL   MPV 11.2 7.5 - 12.5 fL   Neutro Abs 3,456 1,500 - 7,800 cells/uL   Lymphs Abs 1,842 850 - 3,900 cells/uL   Absolute Monocytes 534 200 - 950 cells/uL   Eosinophils Absolute 138 15 - 500 cells/uL   Basophils Absolute 30 0 - 200 cells/uL   Neutrophils Relative % 57.6 %   Total Lymphocyte 30.7 %   Monocytes Relative 8.9 %   Eosinophils Relative 2.3 %   Basophils Relative 0.5 %      Assessment & Plan:   Problem List Items Addressed This Visit   None Visit Diagnoses     Lymphadenopathy of head and neck    -  Primary   Abdominal bloating       Relevant Orders   DG Abd 1 View   Umbilical hernia without obstruction and without gangrene       Relevant Orders   DG Abd 1 View   Eczema of external ear, right       Relevant Medications   acetic acid-hydrocortisone (VOSOL-HC) OTIC solution   Tonsillith       Relevant Medications   amoxicillin-clavulanate (AUGMENTIN) 875-125 MG tablet   Right otitis media with effusion       Relevant Medications   amoxicillin-clavulanate (AUGMENTIN) 875-125 MG tablet   Rhinosinusitis       Relevant Medications   amoxicillin-clavulanate (AUGMENTIN) 875-125 MG tablet   fluticasone (FLONASE) 50 MCG/ACT nasal spray       R Ear Some evidence of eczema with debris flaky inflamed skin within ear canal but no obvious infection or ulceration TM with some hazy effusion otitis media, but no erythema.  Also with R sided tonsiliths Will cover empiric antibiotic course given R sided LAD, trial on Augmentin 10 day  Rx eczema Ear drops for eczema / debris within the ear  Start nasal steroid Flonase 2 sprays in each nostril daily for 4-6 weeks, may repeat course seasonally or as needed. This can reduce fluid behind the ear  Lymph nodes swollen on R side of neck / jaw Localized submandibular and anterior cervical nodes, mild tender, no other lymph nodes identified in neck or clavicle or  axillary  Advised scenarios with LAD, we will wait 4-6 weeks TOTAL, or 2 more weeks to see if they improve at all. If they are improving we can give more time.  If they are NOT resolving, we can offer Ultrasound OR referral to ENT next.  Additional issue  Umbilical Hernia, mild, not obstructed or incarcerated Seems weight gain has contributed. He has already discussed this with his PCP Declined CT due to high cost  KUB Abdominal X-ray ordered for Monday  Limit or scale back Miralax more to see if it helps. Limit bloating  Eventually CT Abdomen is reasonable.  Will route chart to PCP to follow-up KUB result next week.   Meds ordered this encounter  Medications   amoxicillin-clavulanate (AUGMENTIN) 875-125 MG tablet    Sig: Take 1 tablet by mouth 2 (two) times daily.    Dispense:  20 tablet    Refill:  0   acetic acid-hydrocortisone (VOSOL-HC) OTIC solution    Sig: Place 3 drops into both ears 3 (three) times daily.  May use wick for first 24 hours    Dispense:  10 mL    Refill:  0   fluticasone (FLONASE) 50 MCG/ACT nasal spray    Sig: Place 2 sprays into both nostrils daily. Use for 4-6 weeks then stop and use seasonally or as needed.    Dispense:  16 g    Refill:  3      Follow up plan: Return in about 2 weeks (around 11/24/2022), or if symptoms worsen or fail to improve.  Saralyn Pilar, DO [facility] 11/10/2022, 11:20 AM

## 2022-11-10 NOTE — Patient Instructions (Addendum)
Thank you for coming to the office today.  Augmentin antibiotic  Ear drops for eczema / debris within the ear  Start nasal steroid Flonase 2 sprays in each nostril daily for 4-6 weeks, may repeat course seasonally or as needed. This can reduce fluid behind the ear  Lymph nodes swollen on R side of neck / jaw - Give them 4-6 weeks TOTAL, or 2 more weeks to see if they improve at all. If they are improving we can give more time. If they are NOT resolving, we can offer Ultrasound OR referral to ENT next.  Abdomen, confirmed umbilical hernia  KUB Abdominal X-ray ordered for Monday  Limit or scale back Miralax more to see if it helps.  Eventually CT Abdomen is reasonable.   Please schedule a Follow-up Appointment to: Return in about 2 weeks (around 11/24/2022), or if symptoms worsen or fail to improve.  If you have any other questions or concerns, please feel free to call the office or send a message through MyChart. You may also schedule an earlier appointment if necessary.  Additionally, you may be receiving a survey about your experience at our office within a few days to 1 week by e-mail or mail. We value your feedback.  Saralyn Pilar, DO [facility], [place]

## 2022-11-13 ENCOUNTER — Ambulatory Visit
Admission: RE | Admit: 2022-11-13 | Discharge: 2022-11-13 | Disposition: A | Discharge status: Home / Self Care | Payer: 59 | Attending: Family Medicine | Admitting: Family Medicine

## 2022-11-13 ENCOUNTER — Ambulatory Visit
Admission: RE | Admit: 2022-11-13 | Discharge: 2022-11-13 | Disposition: A | Discharge status: Home / Self Care | Payer: 59 | Source: Ambulatory Visit | Attending: Family Medicine | Admitting: Family Medicine

## 2022-11-13 DIAGNOSIS — K429 Umbilical hernia without obstruction or gangrene: Secondary | ICD-10-CM | POA: Insufficient documentation

## 2022-11-13 DIAGNOSIS — R140 Abdominal distension (gaseous): Secondary | ICD-10-CM | POA: Diagnosis present

## 2022-12-05 ENCOUNTER — Encounter: Payer: Self-pay | Admitting: Family Medicine

## 2022-12-05 NOTE — Telephone Encounter (Signed)
I believe this was sent to me in error?

## 2023-01-17 ENCOUNTER — Encounter: Payer: Self-pay | Admitting: Family Medicine

## 2023-01-17 DIAGNOSIS — K429 Umbilical hernia without obstruction or gangrene: Secondary | ICD-10-CM

## 2023-01-22 ENCOUNTER — Ambulatory Visit (INDEPENDENT_AMBULATORY_CARE_PROVIDER_SITE_OTHER): Payer: 59 | Admitting: Surgery

## 2023-01-22 ENCOUNTER — Encounter: Payer: Self-pay | Admitting: Surgery

## 2023-01-22 VITALS — BP 118/66 | HR 67 | Temp 98.0°F | Ht 64.0 in | Wt 144.0 lb

## 2023-01-22 DIAGNOSIS — K429 Umbilical hernia without obstruction or gangrene: Secondary | ICD-10-CM | POA: Diagnosis not present

## 2023-01-22 NOTE — Patient Instructions (Signed)
You have requested for your Umbilical Hernia be repaired. This will be scheduled with Dr. Piscoya at [facility].   Please see your (blue)pre-care sheet for information. Our surgery scheduler will call you to verify surgery date and to go over information.   You will need to arrange to be off work for 1-2 weeks but will have to have a lifting restriction of no more than 15 lbs for 6 weeks following your surgery. If you have FMLA or disability paperwork that needs filled out you may drop this off at our office or this can be faxed to [phone].     Umbilical Hernia, Adult A hernia is a bulge of tissue that pushes through an opening between muscles. An umbilical hernia happens in the abdomen, near the belly button (umbilicus). The hernia may contain tissues from the small intestine, large intestine, or fatty tissue covering the intestines (omentum). Umbilical hernias in adults tend to get worse over time, and they require surgical treatment. There are several types of umbilical hernias. You may have: A hernia located just above or below the umbilicus (indirect hernia). This is the most common type of umbilical hernia in adults. A hernia that forms through an opening formed by the umbilicus (direct hernia). A hernia that comes and goes (reducible hernia). A reducible hernia may be visible only when you strain, lift something heavy, or cough. This type of hernia can be pushed back into the abdomen (reduced). A hernia that traps abdominal tissue inside the hernia (incarcerated hernia). This type of hernia cannot be reduced. A hernia that cuts off blood flow to the tissues inside the hernia (strangulated hernia). The tissues can start to die if this happens. This type of hernia requires emergency treatment.  What are the causes? An umbilical hernia happens when tissue inside the abdomen presses on a weak area of the abdominal muscles. What increases the risk? You may have a  greater risk of this condition if you: Are obese. Have had several pregnancies. Have a buildup of fluid inside your abdomen (ascites). Have had surgery that weakens the abdominal muscles.  What are the signs or symptoms? The main symptom of this condition is a painless bulge at or near the belly button. A reducible hernia may be visible only when you strain, lift something heavy, or cough. Other symptoms may include: Dull pain. A feeling of pressure.  Symptoms of a strangulated hernia may include: Pain that gets increasingly worse. Nausea and vomiting. Pain when pressing on the hernia. Skin over the hernia becoming red or purple. Constipation. Blood in the stool.  How is this diagnosed? This condition may be diagnosed based on: A physical exam. You may be asked to cough or strain while standing. These actions increase the pressure inside your abdomen and force the hernia through the opening in your muscles. Your health care provider may try to reduce the hernia by pressing on it. Your symptoms and medical history.  How is this treated? Surgery is the only treatment for an umbilical hernia. Surgery for a strangulated hernia is done as soon as possible. If you have a small hernia that is not incarcerated, you may need to lose weight before having surgery. Follow these instructions at home: Lose weight, if told by your health care provider. Do not try to push the hernia back in. Watch your hernia for any changes in color or size. Tell your health care provider if any changes occur. You may need to avoid activities   that increase pressure on your hernia. Do not lift anything that is heavier than 10 lb (4.5 kg) until your health care provider says that this is safe. Take over-the-counter and prescription medicines only as told by your health care provider. Keep all follow-up visits as told by your health care provider. This is important. Contact a health care provider if: Your hernia  gets larger. Your hernia becomes painful. Get help right away if: You develop sudden, severe pain near the area of your hernia. You have pain as well as nausea or vomiting. You have pain and the skin over your hernia changes color. You develop a fever. This information is not intended to replace advice given to you by your health care provider. Make sure you discuss any questions you have with your health care provider. Document Released: 08/13/2015 Document Revised: 11/14/2015 Document Reviewed: 08/13/2015 Elsevier Interactive Patient Education  2018 [employer] Inc[employer]    

## 2023-01-22 NOTE — Progress Notes (Signed)
01/22/2023  History of Present Illness: Justin Short is a 47 y.o. male presenting for evaluation of an umbilical hernia.  He is known to my practice for prior EUA and hemorrhoidectomy on 04/11/21.  He reports he's healed well from that and is taking Miralax daily to keep regular.  He reports that over this year, he has developed some abdominal issues, mainly the development of an umbilical hernia.  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  He also coughs a lot from smoking marijuana and/or vaping, and when he coughs, he has to push on his umbilicus with his hand.  He also feels some pulling sensation with his rectus muscles in the upper abdomen and also with the obliques on his bilateral sides.  Denies any nausea or vomiting.  He does report gaining some weight.  Over the past few months, he feels that the umbilical hernia has grown bigger in size with more bulging.  His PCP ordered a CT scan of his abdomen for further evaluation, but he declined due to cost.  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 Authorizing Provider  acetaminophen (TYLENOL) 500 MG tablet Take 2 tablets (1,000 mg total) by mouth every 6 (six)  hours as needed for mild pain. 04/11/21  Yes Future Yeldell, Elita Quick, MD  ibuprofen (ADVIL) 800 MG tablet Take 1 tablet (800 mg total) by mouth every 8 (eight) hours as needed for moderate pain. 06/23/21  Yes Donovan Kail, PA-C  loratadine (CLARITIN) 10 MG tablet Take 10 mg by mouth daily as needed for allergies.   Yes [provider]  polyethylene glycol (MIRALAX / GLYCOLAX) 17 g packet Take 17 g by mouth daily.   Yes [provider]    Allergies: No Known Allergies  Review of Systems: Review of Systems  Constitutional:  Negative for chills and fever.  HENT:  Negative for hearing loss.   Respiratory:  Negative for shortness of breath.   Cardiovascular:  Negative for chest pain.  Gastrointestinal:  Positive for abdominal pain and constipation. Negative for nausea and vomiting.  Genitourinary:  Negative for dysuria.  Musculoskeletal:  Positive for myalgias.  Skin:  Negative for rash.  Neurological:  Negative for dizziness.  Psychiatric/Behavioral:  Negative for depression.     Physical Exam BP 118/66   Pulse 67   Temp 98 F (36.7 C)   Ht 5\' 4"  (1.626 m)   Wt 144 lb (65.3 kg)   SpO2 97%   BMI 24.72 kg/m  CONSTITUTIONAL: No acute distress, well nourished. HEENT:  Normocephalic, atraumatic, extraocular motion intact. NECK:  Trachea is midline, no jugular venous distention. RESPIRATORY:  Lungs are clear, and breath sounds are equal bilaterally. Normal respiratory effort without pathologic use of accessory muscles. CARDIOVASCULAR: Heart is regular without murmurs, gallops, or rubs. GI: The abdomen is soft, non-distended, with some discomfort to palpation at the umbilicus.  He has a small reducible umbilical hernia, without any evidence of strangulation.  He has a very mild/borderline diastasis, but otherwise I do not see any issues with the musculature at the sites of discomfort. There were no palpable masses. MUSCULOSKELETAL:  Normal gait, no peripheral  edema. NEUROLOGIC:  Motor and sensation is grossly normal.  Cranial nerves are grossly intact. PSYCH:  Alert and oriented to person, place and time. Affect is normal.  Labs/Imaging: Labs from 08/30/22: Na 140, K 4.4, Cl 104, CO2 26, BUN 8, Cr 0.85.  LFTs within normal.  WBC 6, Hgb 15, Hct 44.2, Plt 280.  Assessment and Plan: This is a 47 y.o. male with an umbilical hernia.  --Discussed with the patient that indeed he has a small umbilical hernia, which is reducible.  He does have some discomfort with it, but discussed with him that it is not clear that his hernia is causing some of his other symptoms throughout his abdomen.  Discussed how the preperitoneal fat that is protruding could lead to pulling sensation from an area around the umbilicus, but his symptoms are epigastric toward the top of the bilateral rectus muscles, and also on the bilateral sides by the obliques.  However, given that the umbilical hernia itself does cause symptoms would recommend proceeding with repair now that the hernia is small before it can get worse.  He is in agreement. --Discussed with him then the plan for an open umbilical hernia repair, and reviewed the surgery at length with him including the planned incision, the risks of bleeding, infection, injury to surrounding structures, the use of mesh, that this would be an outpatient surgery, post-operative activity restrictions, pain control, and he's willing to proceed. --Will schedule his surgery on 03/15/23 based on patient preference.  He will follow up early December for H&P update.  I spent 40 minutes dedicated to the care of this patient on the date of this encounter to include pre-visit review of records, face-to-face time with the patient discussing diagnosis and management, and any post-visit coordination of care.   Howie Ill, MD [facility]

## 2023-01-23 ENCOUNTER — Telehealth: Payer: Self-pay | Admitting: Surgery

## 2023-01-23 NOTE — Telephone Encounter (Signed)
Patient has been advised of Pre-Admission date/time, and Surgery date at [facility].  Surgery Date: 03/15/23 Preadmission Testing Date: 03/08/23 (phone 8am-1pm)  Patient has been made aware to call [phone], between 1-3:00pm the day before surgery, to find out what time to arrive for surgery.

## 2023-02-26 ENCOUNTER — Ambulatory Visit (INDEPENDENT_AMBULATORY_CARE_PROVIDER_SITE_OTHER): Payer: 59 | Admitting: Surgery

## 2023-02-26 ENCOUNTER — Encounter: Payer: Self-pay | Admitting: Surgery

## 2023-02-26 VITALS — BP 118/75 | HR 65 | Temp 98.2°F | Ht 64.0 in | Wt 143.2 lb

## 2023-02-26 DIAGNOSIS — K429 Umbilical hernia without obstruction or gangrene: Secondary | ICD-10-CM

## 2023-02-26 DIAGNOSIS — M6208 Separation of muscle (nontraumatic), other site: Secondary | ICD-10-CM | POA: Diagnosis not present

## 2023-02-26 NOTE — Patient Instructions (Signed)
 You have requested for your Umbilical Hernia be repaired. This will be scheduled with Dr. Aleen Campi at [facility].   Please see your (blue)pre-care sheet for information. Our surgery scheduler will call you to verify surgery date and to go over information.   You will need to arrange to be off work for 1-2 weeks but will have to have a lifting restriction of no more than 15 lbs for 6 weeks following your surgery. If you have FMLA or disability paperwork that needs filled out you may drop this off at our office or this can be faxed to [phone].     Umbilical Hernia, Adult A hernia is a bulge of tissue that pushes through an opening between muscles. An umbilical hernia happens in the abdomen, near the belly button (umbilicus). The hernia may contain tissues from the small intestine, large intestine, or fatty tissue covering the intestines (omentum). Umbilical hernias in adults tend to get worse over time, and they require surgical treatment. There are several types of umbilical hernias. You may have: A hernia located just above or below the umbilicus (indirect hernia). This is the most common type of umbilical hernia in adults. A hernia that forms through an opening formed by the umbilicus (direct hernia). A hernia that comes and goes (reducible hernia). A reducible hernia may be visible only when you strain, lift something heavy, or cough. This type of hernia can be pushed back into the abdomen (reduced). A hernia that traps abdominal tissue inside the hernia (incarcerated hernia). This type of hernia cannot be reduced. A hernia that cuts off blood flow to the tissues inside the hernia (strangulated hernia). The tissues can start to die if this happens. This type of hernia requires emergency treatment.  What are the causes? An umbilical hernia happens when tissue inside the abdomen presses on a weak area of the abdominal muscles. What increases the risk? You may have a  greater risk of this condition if you: Are obese. Have had several pregnancies. Have a buildup of fluid inside your abdomen (ascites). Have had surgery that weakens the abdominal muscles.  What are the signs or symptoms? The main symptom of this condition is a painless bulge at or near the belly button. A reducible hernia may be visible only when you strain, lift something heavy, or cough. Other symptoms may include: Dull pain. A feeling of pressure.  Symptoms of a strangulated hernia may include: Pain that gets increasingly worse. Nausea and vomiting. Pain when pressing on the hernia. Skin over the hernia becoming red or purple. Constipation. Blood in the stool.  How is this diagnosed? This condition may be diagnosed based on: A physical exam. You may be asked to cough or strain while standing. These actions increase the pressure inside your abdomen and force the hernia through the opening in your muscles. Your health care provider may try to reduce the hernia by pressing on it. Your symptoms and medical history.  How is this treated? Surgery is the only treatment for an umbilical hernia. Surgery for a strangulated hernia is done as soon as possible. If you have a small hernia that is not incarcerated, you may need to lose weight before having surgery. Follow these instructions at home: Lose weight, if told by your health care provider. Do not try to push the hernia back in. Watch your hernia for any changes in color or size. Tell your health care provider if any changes occur. You may need to avoid activities  that increase pressure on your hernia. Do not lift anything that is heavier than 10 lb (4.5 kg) until your health care provider says that this is safe. Take over-the-counter and prescription medicines only as told by your health care provider. Keep all follow-up visits as told by your health care provider. This is important. Contact a health care provider if: Your hernia  gets larger. Your hernia becomes painful. Get help right away if: You develop sudden, severe pain near the area of your hernia. You have pain as well as nausea or vomiting. You have pain and the skin over your hernia changes color. You develop a fever. This information is not intended to replace advice given to you by your health care provider. Make sure you discuss any questions you have with your health care provider. Document Released: 08/13/2015 Document Revised: 11/14/2015 Document Reviewed: 08/13/2015 Elsevier Interactive Patient Education  [employer].

## 2023-02-26 NOTE — Progress Notes (Signed)
02/26/2023  History of Present Illness: Justin Short is a 47 y.o. male presenting for follow up of an umbilical hernia.  The patient is scheduled for open umbilical hernia repair on 03/15/23 and he presents today for H&P update.  Overall he reports that his hernia is stable, and he thinks he may be feeling something additional in the epigastric area.  He's unsure if it's muscular or not.  Denies any worsening pain, nausea, or vomiting.    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 Authorizing Provider  acetaminophen (TYLENOL) 500 MG tablet Take 2 tablets (1,000 mg total) by mouth every 6 (six) hours as needed for mild pain. 04/11/21   Henrene Dodge, MD  ibuprofen (ADVIL) 800 MG tablet Take 1 tablet (800 mg total) by mouth every 8 (eight) hours as needed for moderate pain. 06/23/21   Donovan Kail, PA-C  loratadine (CLARITIN) 10 MG tablet Take 10 mg by mouth daily as needed for allergies.    [provider]  polyethylene glycol (MIRALAX / GLYCOLAX) 17 g packet Take 17 g by mouth daily. Patient not taking: Reported on 02/26/2023    [provider]    Allergies: No Known Allergies  Review of  Systems: Review of Systems  Constitutional:  Negative for chills and fever.  Respiratory:  Negative for shortness of breath.   Cardiovascular:  Negative for chest pain.  Gastrointestinal:  Positive for abdominal pain (at umbilical area). Negative for nausea and vomiting.    Physical Exam BP 118/75   Pulse 65   Temp 98.2 F (36.8 C)   Ht 5\' 4"  (1.626 m)   Wt 143 lb 3.2 oz (65 kg)   SpO2 99%   BMI 24.58 kg/m  CONSTITUTIONAL: No acute distress, well nourished. HEENT:  Normocephalic, atraumatic, extraocular motion intact. RESPIRATORY:  Lungs are clear, and breath sounds are equal bilaterally. Normal respiratory effort without pathologic use of accessory muscles. CARDIOVASCULAR: Heart is regular without murmurs, gallops, or rubs. GI: The abdomen is soft, non-distended, with soreness to palpation at the umbilicus.  The patient has a reducible umbilical hernia that is small, 1 cm or less.  He also has very minimal diastasis recti of about 1.5 cm in separation.  No other palpable hernias or masses/lumps. NEUROLOGIC:  Motor and sensation is grossly normal.  Cranial nerves are grossly intact. PSYCH:  Alert and oriented to person, place and time. Affect is normal.   Assessment and Plan: This is a 47 y.o. male with a reducible umbilical hernia.  --Discussed with the patient that his hernia is stable in size.  No other palpable hernia defects or lumps/masses that  could explain other areas of discomfort.  He has minimal diastasis of 1.5 cm separation.  Discussed with him that the diastasis is not a hernia itself and does not require repair, and the separation is relatively small that also may not require any procedures.  However, if interested in plication of the diastasis, this would be done via robotic approach, while repairing his umbilical hernia and placing mesh to reinforce the repair.  Discussed that as an open approach, the umbilical hernia would not require mesh since the defect is < 1 cm in  size and would have to cut through healthy fascia in order to fit any mesh through.  After further discussion and thought, he has decided to keep the original plan and proceed with open umbilical hernia repair.   --Reviewed the surgery at length with him including planned incision, risks of bleeding, infection, injury to surrounding structures, that this is an outpatient surgery, postoperative activity restriction, pain control, and he's willing to proceed. --All questions have been answered.  I spent 30 minutes dedicated to the care of this patient on the date of this encounter to include pre-visit review of records, face-to-face time with the patient discussing diagnosis and management, and any post-visit coordination of care.   Howie Ill, MD [facility]

## 2023-02-28 ENCOUNTER — Encounter: Payer: Self-pay | Admitting: Surgery

## 2023-03-02 ENCOUNTER — Ambulatory Visit (INDEPENDENT_AMBULATORY_CARE_PROVIDER_SITE_OTHER): Payer: 59 | Admitting: Surgery

## 2023-03-02 ENCOUNTER — Encounter: Payer: Self-pay | Admitting: Surgery

## 2023-03-02 VITALS — BP 112/70 | HR 60 | Temp 98.0°F | Ht 64.0 in | Wt 142.0 lb

## 2023-03-02 DIAGNOSIS — K429 Umbilical hernia without obstruction or gangrene: Secondary | ICD-10-CM | POA: Diagnosis not present

## 2023-03-02 DIAGNOSIS — M6208 Separation of muscle (nontraumatic), other site: Secondary | ICD-10-CM

## 2023-03-02 NOTE — H&P (View-Only) (Signed)
 03/02/2023  History of Present Illness: Justin Short is a 47 y.o. male with an umbilical hernia and diastases recti presenting for follow-up prior to surgery.  He was last seen on 02/26/23 and after further discussion on that day, he decided to continue the plan for an open umbilical hernia repair.  However after further thought, he has decided to change the surgical plan to a robotic approach.  He would rather be able to evaluate better from the inside to make sure there are no other hernias present and also to be able to plicate the diastases while placing a mesh to reinforce over the repair.  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 Authorizing Provider  acetaminophen (TYLENOL) 500 MG tablet Take 2 tablets (1,000 mg total) by mouth every 6 (six) hours as needed for mild pain. 04/11/21  Yes Jayshun Galentine, Elita Quick, MD  ibuprofen (ADVIL) 800 MG tablet Take 1 tablet (800 mg total) by mouth every 8 (eight) hours as needed for moderate pain. Patient taking differently: Take 400 mg by mouth every 8 (eight) hours as needed for moderate pain (pain score 4-6). 06/23/21  Yes Lynden Oxford R, PA-C  psyllium (METAMUCIL) 58.6 % powder Take 1  packet by mouth daily.   Yes [provider]    Allergies: No Known Allergies  Review of Systems: Review of Systems  Constitutional:  Negative for chills and fever.  Respiratory:  Negative for shortness of breath.   Cardiovascular:  Negative for chest pain.  Gastrointestinal:  Negative for nausea and vomiting.    Physical Exam BP 112/70   Pulse 60   Temp 98 F (36.7 C)   Ht 5\' 4"  (1.626 m)   Wt 142 lb (64.4 kg)   SpO2 97%   BMI 24.37 kg/m  CONSTITUTIONAL: No acute distress, well-nourished HEENT:  Normocephalic, atraumatic, extraocular motion intact. RESPIRATORY:  Normal respiratory effort without pathologic use of accessory muscles. CARDIOVASCULAR: Regular rhythm and rate GI: The abdomen is soft, nondistended, nontender to palpation.  Patient has a stable exam with an umbilical hernia and diastases.  NEUROLOGIC:  Motor and sensation is grossly normal.  Cranial nerves are grossly intact. PSYCH:  Alert and oriented to person, place and time. Affect is normal.   Assessment and Plan: This is a 47 y.o. male with an umbilical hernia and diastases recti.  - Discussed with the patient that we can certainly change the surgical approach to a robotic surgery.  Discussed with him that by doing a minimally invasive robotic approach, I would be able to evaluate the entire midline make sure there are no other hernias.  Discussed also that via this  approach, I would be able to plicate the diastases recti at the same time insert the mesh to reinforce the entire repair.  Discussed with him the planned incisions, risks of bleeding, infection, injury to surrounding structures, that this will continue to remain as an outpatient procedure, postoperative activity restrictions, pain control, and he is willing to proceed. - The patient still scheduled for surgery on 03/15/2023 but now will be scheduled for robotic assisted umbilical hernia repair with plication of diastases recti.  All of his  questions have been answered.  I spent 20 minutes dedicated to the care of this patient on the date of this encounter to include pre-visit review of records, face-to-face time with the patient discussing diagnosis and management, and any post-visit coordination of care.   Howie Ill, MD [facility]

## 2023-03-02 NOTE — Progress Notes (Signed)
03/02/2023  History of Present Illness: Justin Short is a 47 y.o. male with an umbilical hernia and diastases recti presenting for follow-up prior to surgery.  He was last seen on 02/26/23 and after further discussion on that day, he decided to continue the plan for an open umbilical hernia repair.  However after further thought, he has decided to change the surgical plan to a robotic approach.  He would rather be able to evaluate better from the inside to make sure there are no other hernias present and also to be able to plicate the diastases while placing a mesh to reinforce over the repair.  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)but when he went back to pcp said it looks as though pt has both functioning kidneys     Past Surgical History: Past Surgical History:  Procedure Laterality Date   EVALUATION UNDER ANESTHESIA WITH HEMORRHOIDECTOMY N/A 04/11/2021   Procedure: EXAM UNDER ANESTHESIA WITH HEMORRHOIDECTOMY;  Surgeon: Justin Dodge, MD;  Location: [facility] ORS;  Service: General;  Laterality: N/A;   GANGLION CYST EXCISION Left    pinky finger   sphincterotomy  2006   lateral internal sphincterotomy for anal fissure    Home Medications: Prior to Admission medications   Medication Sig Start Date End Date Taking? Authorizing Provider  acetaminophen (TYLENOL) 500 MG tablet Take 2 tablets (1,000 mg total) by mouth every 6 (six) hours as needed for mild pain. 04/11/21  Yes Justin Short, Justin Quick, MD  ibuprofen (ADVIL) 800 MG tablet Take 1 tablet (800 mg total) by mouth every 8 (eight) hours as needed for moderate pain. Patient taking differently: Take 400 mg by mouth every 8 (eight) hours as needed for moderate pain (pain score 4-6). 06/23/21  Yes Justin Oxford R, PA-C  psyllium (METAMUCIL) 58.6 % powder Take 1  packet by mouth daily.   Yes [provider]    Allergies: No Known Allergies  Review of Systems: Review of Systems  Constitutional:  Negative for chills and fever.  Respiratory:  Negative for shortness of breath.   Cardiovascular:  Negative for chest pain.  Gastrointestinal:  Negative for nausea and vomiting.    Physical Exam BP 112/70   Pulse 60   Temp 98 F (36.7 C)   Ht 5\' 4"  (1.626 m)   Wt 142 lb (64.4 kg)   SpO2 97%   BMI 24.37 kg/m  CONSTITUTIONAL: No acute distress, well-nourished HEENT:  Normocephalic, atraumatic, extraocular motion intact. RESPIRATORY:  Normal respiratory effort without pathologic use of accessory muscles. CARDIOVASCULAR: Regular rhythm and rate GI: The abdomen is soft, nondistended, nontender to palpation.  Patient has a stable exam with an umbilical hernia and diastases.  NEUROLOGIC:  Motor and sensation is grossly normal.  Cranial nerves are grossly intact. PSYCH:  Alert and oriented to person, place and time. Affect is normal.   Assessment and Plan: This is a 47 y.o. male with an umbilical hernia and diastases recti.  - Discussed with the patient that we can certainly change the surgical approach to a robotic surgery.  Discussed with him that by doing a minimally invasive robotic approach, I would be able to evaluate the entire midline make sure there are no other hernias.  Discussed also that via this  approach, I would be able to plicate the diastases recti at the same time insert the mesh to reinforce the entire repair.  Discussed with him the planned incisions, risks of bleeding, infection, injury to surrounding structures, that this will continue to remain as an outpatient procedure, postoperative activity restrictions, pain control, and he is willing to proceed. - The patient still scheduled for surgery on 03/15/2023 but now will be scheduled for robotic assisted umbilical hernia repair with plication of diastases recti.  All of his  questions have been answered.  I spent 20 minutes dedicated to the care of this patient on the date of this encounter to include pre-visit review of records, face-to-face time with the patient discussing diagnosis and management, and any post-visit coordination of care.   Howie Ill, MD [facility]

## 2023-03-02 NOTE — Patient Instructions (Signed)
You have requested for your Umbilical Hernia be repaired. This will be scheduled with Dr. Aleen Campi at [facility].   You will need to arrange to be off work for 1-2 weeks but will have to have a lifting restriction of no more than 15 lbs for 6 weeks following your surgery. If you have FMLA or disability paperwork that needs filled out you may drop this off at our office or this can be faxed to [phone].     Umbilical Hernia, Adult A hernia is a bulge of tissue that pushes through an opening between muscles. An umbilical hernia happens in the abdomen, near the belly button (umbilicus). The hernia may contain tissues from the small intestine, large intestine, or fatty tissue covering the intestines (omentum). Umbilical hernias in adults tend to get worse over time, and they require surgical treatment. There are several types of umbilical hernias. You may have: A hernia located just above or below the umbilicus (indirect hernia). This is the most common type of umbilical hernia in adults. A hernia that forms through an opening formed by the umbilicus (direct hernia). A hernia that comes and goes (reducible hernia). A reducible hernia may be visible only when you strain, lift something heavy, or cough. This type of hernia can be pushed back into the abdomen (reduced). A hernia that traps abdominal tissue inside the hernia (incarcerated hernia). This type of hernia cannot be reduced. A hernia that cuts off blood flow to the tissues inside the hernia (strangulated hernia). The tissues can start to die if this happens. This type of hernia requires emergency treatment.  What are the causes? An umbilical hernia happens when tissue inside the abdomen presses on a weak area of the abdominal muscles. What increases the risk? You may have a greater risk of this condition if you: Are obese. Have had several pregnancies. Have a buildup of fluid inside your abdomen (ascites). Have  had surgery that weakens the abdominal muscles.  What are the signs or symptoms? The main symptom of this condition is a painless bulge at or near the belly button. A reducible hernia may be visible only when you strain, lift something heavy, or cough. Other symptoms may include: Dull pain. A feeling of pressure.  Symptoms of a strangulated hernia may include: Pain that gets increasingly worse. Nausea and vomiting. Pain when pressing on the hernia. Skin over the hernia becoming red or purple. Constipation. Blood in the stool.  How is this diagnosed? This condition may be diagnosed based on: A physical exam. You may be asked to cough or strain while standing. These actions increase the pressure inside your abdomen and force the hernia through the opening in your muscles. Your health care provider may try to reduce the hernia by pressing on it. Your symptoms and medical history.  How is this treated? Surgery is the only treatment for an umbilical hernia. Surgery for a strangulated hernia is done as soon as possible. If you have a small hernia that is not incarcerated, you may need to lose weight before having surgery. Follow these instructions at home: Lose weight, if told by your health care provider. Do not try to push the hernia back in. Watch your hernia for any changes in color or size. Tell your health care provider if any changes occur. You may need to avoid activities that increase pressure on your hernia. Do not lift anything that is heavier than 10 lb (4.5 kg) until your health care provider says  that this is safe. Take over-the-counter and prescription medicines only as told by your health care provider. Keep all follow-up visits as told by your health care provider. This is important. Contact a health care provider if: Your hernia gets larger. Your hernia becomes painful. Get help right away if: You develop sudden, severe pain near the area of your hernia. You have pain  as well as nausea or vomiting. You have pain and the skin over your hernia changes color. You develop a fever. This information is not intended to replace advice given to you by your health care provider. Make sure you discuss any questions you have with your health care provider. Document Released: 08/13/2015 Document Revised: 11/14/2015 Document Reviewed: 08/13/2015 Elsevier Interactive Patient Education  [employer].

## 2023-03-08 ENCOUNTER — Other Ambulatory Visit: Payer: Self-pay

## 2023-03-08 ENCOUNTER — Encounter
Admission: RE | Admit: 2023-03-08 | Discharge: 2023-03-08 | Disposition: A | Discharge status: Home / Self Care | Payer: 59 | Source: Ambulatory Visit | Attending: Surgery | Admitting: Surgery

## 2023-03-08 NOTE — Patient Instructions (Signed)
Your procedure is scheduled on: Thursday 03/15/23 To find out your arrival time, please call [phone] between 1PM - 3PM on:  Wednesday 03/14/23  Report to the Registration Desk on the 1st floor of the [facility]. FREE Valet parking is available.  If your arrival time is 6:00 am, do not arrive before that time as the [facility] entrance doors do not open until 6:00 am.  REMEMBER: Instructions that are not followed completely may result in serious medical risk, up to and including death; or upon the discretion of your surgeon and anesthesiologist your surgery may need to be rescheduled.  Do not eat food after midnight the night before surgery.  No gum chewing or hard candies.  You may however, drink CLEAR liquids up to 2 hours before you are scheduled to arrive for your surgery. Do not drink anything within 2 hours of your scheduled arrival time.  Clear liquids include: - water  - apple juice without pulp - gatorade (not RED colors) - black coffee or tea (Do NOT add milk or creamers to the coffee or tea) Do NOT drink anything that is not on this list.  Type 1 and Type 2 diabetics should only drink water.  One week prior to surgery: Stop Anti-inflammatories (NSAIDS) such as Advil, Aleve, Ibuprofen, Motrin, Naproxen, Naprosyn and Aspirin based products such as Excedrin, Goody's Powder, BC Powder. You may however, continue to take Tylenol if needed for pain up until the day of surgery.  Stop ANY OVER THE COUNTER supplements until after surgery.  Continue taking all prescribed medications.:  TAKE ONLY THESE MEDICATIONS THE MORNING OF SURGERY WITH A SIP OF WATER:  none  No Alcohol for 24 hours before or after surgery.  No Smoking including e-cigarettes for 24 hours before surgery.  No chewable tobacco products for at least 6 hours before surgery.  No nicotine patches on the day of surgery.  Do not use any "recreational" drugs for at least a week (preferably 2 weeks)  before your surgery.  Please be advised that the combination of cocaine and anesthesia may have negative outcomes, up to and including death. If you test positive for cocaine, your surgery will be cancelled.  On the morning of surgery brush your teeth with toothpaste and water, you may rinse your mouth with mouthwash if you wish. Do not swallow any toothpaste or mouthwash.  Use CHG Soap or wipes as directed on instruction sheet.  Do not wear lotions, powders, or perfumes.   Do not shave body hair from the neck down 48 hours before surgery.  Wear clean comfortable clothing (specific to your surgery type) to the hospital.  Do not wear jewelry, make-up, hairpins, clips or nail polish.  For welded (permanent) jewelry: bracelets, anklets, waist bands, etc.  Please have this removed prior to surgery.  If it is not removed, there is a chance that hospital personnel will need to cut it off on the day of surgery. Contact lenses, hearing aids and dentures may not be worn into surgery.  Do not bring valuables to the hospital. [facility] is not responsible for any missing/lost belongings or valuables.   Notify your doctor if there is any change in your medical condition (cold, fever, infection).  If you are being discharged the day of surgery, you will not be allowed to drive home. You will need a responsible individual to drive you home and stay with you for 24 hours after surgery.   If you are taking public transportation,  you will need to have a responsible individual with you.  If you are being admitted to the hospital overnight, leave your suitcase in the car. After surgery it may be brought to your room.  In case of increased patient census, it may be necessary for you, the patient, to continue your postoperative care in the Same Day Surgery department.  After surgery, you can help prevent lung complications by doing breathing exercises.  Take deep breaths and cough every 1-2 hours. Your  doctor may order a device called an Incentive Spirometer to help you take deep breaths. When coughing or sneezing, hold a pillow firmly against your incision with both hands. This is called "splinting." Doing this helps protect your incision. It also decreases belly discomfort.  Surgery Visitation Policy:  Patients undergoing a surgery or procedure may have two family members or support persons with them as long as the person is not COVID-19 positive or experiencing its symptoms.   Inpatient Visitation:    Visiting hours are 7 a.m. to 8 p.m. Up to four visitors are allowed at one time in a patient room. The visitors may rotate out with other people during the day. One designated support person (adult) may remain overnight.  Please call the Pre-admissions Testing Dept. at [phone] if you have any questions about these instructions.     Preparing for Surgery with CHLORHEXIDINE GLUCONATE (CHG) Soap  Chlorhexidine Gluconate (CHG) Soap  o An antiseptic cleaner that kills germs and bonds with the skin to continue killing germs even after washing  o Used for showering the night before surgery and morning of surgery  Before surgery, you can play an important role by reducing the number of germs on your skin.  CHG (Chlorhexidine gluconate) soap is an antiseptic cleanser which kills germs and bonds with the skin to continue killing germs even after washing.  Please do not use if you have an allergy to CHG or antibacterial soaps. If your skin becomes reddened/irritated stop using the CHG.  1. Shower the NIGHT BEFORE SURGERY and the MORNING OF SURGERY with CHG soap.  2. If you choose to wash your hair, wash your hair first as usual with your normal shampoo.  3. After shampooing, rinse your hair and body thoroughly to remove the shampoo.  4. Use CHG as you would any other liquid soap. You can apply CHG directly to the skin and wash gently with a scrungie or a clean washcloth.  5. Apply  the CHG soap to your body only from the neck down. Do not use on open wounds or open sores. Avoid contact with your eyes, ears, mouth, and genitals (private parts). Wash face and genitals (private parts) with your normal soap.  6. Wash thoroughly, paying special attention to the area where your surgery will be performed.  7. Thoroughly rinse your body with warm water.  8. Do not shower/wash with your normal soap after using and rinsing off the CHG soap.  9. Pat yourself dry with a clean towel.  10. Wear clean pajamas to bed the night before surgery.  12. Place clean sheets on your bed the night of your first shower and do not sleep with pets.  13. Shower again with the CHG soap on the day of surgery prior to arriving at the hospital.  14. Do not apply any deodorants/lotions/powders.  15. Please wear clean clothes to the hospital.

## 2023-03-14 MED ORDER — CEFAZOLIN SODIUM-DEXTROSE 2-4 GM/100ML-% IV SOLN
2.0000 g | INTRAVENOUS | Status: AC
  Administered 2023-03-15: 2 g via INTRAVENOUS

## 2023-03-14 MED ORDER — ACETAMINOPHEN 500 MG PO TABS
1000.0000 mg | ORAL_TABLET | ORAL | Status: AC
Start: 2023-03-15 — End: 2023-03-16
  Administered 2023-03-15: 1000 mg via ORAL

## 2023-03-14 MED ORDER — CHLORHEXIDINE GLUCONATE 0.12 % MT SOLN
15.0000 mL | Freq: Once | OROMUCOSAL | Status: AC
  Administered 2023-03-15: 15 mL via OROMUCOSAL

## 2023-03-14 MED ORDER — ORAL CARE MOUTH RINSE: 15.0000 mL | Freq: Once | OROMUCOSAL | Status: AC

## 2023-03-14 MED ORDER — CHLORHEXIDINE GLUCONATE CLOTH 2 % EX PADS: 6.0000 | MEDICATED_PAD | Freq: Once | CUTANEOUS | Status: DC

## 2023-03-14 MED ORDER — LACTATED RINGERS IV SOLN: INTRAVENOUS | Status: DC

## 2023-03-14 MED ORDER — BUPIVACAINE LIPOSOME 1.3 % IJ SUSP: 10.0000 mL | Freq: Once | INTRAMUSCULAR | Status: DC

## 2023-03-14 MED ORDER — GABAPENTIN 300 MG PO CAPS
300.0000 mg | ORAL_CAPSULE | ORAL | Status: AC
  Administered 2023-03-15: 300 mg via ORAL

## 2023-03-15 ENCOUNTER — Ambulatory Visit: Payer: 59 | Admitting: Urgent Care

## 2023-03-15 ENCOUNTER — Ambulatory Visit
Admission: RE | Admit: 2023-03-15 | Discharge: 2023-03-15 | Disposition: A | Discharge status: Home / Self Care | Payer: 59 | Attending: Surgery | Admitting: Surgery

## 2023-03-15 ENCOUNTER — Ambulatory Visit: Payer: 59 | Admitting: Certified Registered"

## 2023-03-15 ENCOUNTER — Other Ambulatory Visit: Payer: Self-pay

## 2023-03-15 ENCOUNTER — Encounter
Admission: RE | Disposition: A | Discharge status: Home / Self Care | Payer: Self-pay | Source: Home / Self Care | Attending: Surgery

## 2023-03-15 ENCOUNTER — Encounter: Payer: Self-pay | Admitting: Surgery

## 2023-03-15 DIAGNOSIS — M6208 Separation of muscle (nontraumatic), other site: Secondary | ICD-10-CM | POA: Diagnosis not present

## 2023-03-15 DIAGNOSIS — K429 Umbilical hernia without obstruction or gangrene: Secondary | ICD-10-CM | POA: Diagnosis present

## 2023-03-15 HISTORY — PX: INSERTION OF MESH: SHX5868

## 2023-03-15 HISTORY — PX: XI ROBOTIC ASSISTED VENTRAL HERNIA: SHX6789

## 2023-03-15 SURGERY — REPAIR, HERNIA, VENTRAL, ROBOT-ASSISTED
Anesthesia: General | Site: Abdomen

## 2023-03-15 MED ORDER — PROPOFOL 10 MG/ML IV BOLUS
INTRAVENOUS | Status: AC
  Filled 2023-03-15: qty 20

## 2023-03-15 MED ORDER — OXYCODONE HCL 5 MG/5ML PO SOLN: 5.0000 mg | Freq: Once | ORAL | Status: DC | PRN

## 2023-03-15 MED ORDER — FENTANYL CITRATE (PF) 100 MCG/2ML IJ SOLN
INTRAMUSCULAR | Status: DC | PRN
  Administered 2023-03-15: 100 ug via INTRAVENOUS
  Administered 2023-03-15: 50 ug via INTRAVENOUS

## 2023-03-15 MED ORDER — OXYCODONE HCL 5 MG PO TABS: 5.0000 mg | ORAL_TABLET | Freq: Once | ORAL | Status: DC | PRN

## 2023-03-15 MED ORDER — KETOROLAC TROMETHAMINE 30 MG/ML IJ SOLN
INTRAMUSCULAR | Status: DC | PRN
  Administered 2023-03-15: 30 mg via INTRAVENOUS

## 2023-03-15 MED ORDER — ROCURONIUM BROMIDE 100 MG/10ML IV SOLN
INTRAVENOUS | Status: DC | PRN
  Administered 2023-03-15: 10 mg via INTRAVENOUS
  Administered 2023-03-15: 60 mg via INTRAVENOUS

## 2023-03-15 MED ORDER — CEFAZOLIN SODIUM-DEXTROSE 2-4 GM/100ML-% IV SOLN
INTRAVENOUS | Status: AC
  Filled 2023-03-15: qty 100

## 2023-03-15 MED ORDER — MIDAZOLAM HCL 2 MG/2ML IJ SOLN
INTRAMUSCULAR | Status: DC | PRN
  Administered 2023-03-15: 2 mg via INTRAVENOUS

## 2023-03-15 MED ORDER — ACETAMINOPHEN 500 MG PO TABS: 1000.0000 mg | ORAL_TABLET | Freq: Four times a day (QID) | ORAL | Status: DC | PRN

## 2023-03-15 MED ORDER — FENTANYL CITRATE (PF) 100 MCG/2ML IJ SOLN: 25.0000 ug | INTRAMUSCULAR | Status: DC | PRN

## 2023-03-15 MED ORDER — SODIUM CHLORIDE 0.9 % IV SOLN
INTRAVENOUS | Status: DC | PRN
  Administered 2023-03-15: 50 mL

## 2023-03-15 MED ORDER — PROPOFOL 10 MG/ML IV BOLUS
INTRAVENOUS | Status: DC | PRN
  Administered 2023-03-15: 150 mg via INTRAVENOUS

## 2023-03-15 MED ORDER — ACETAMINOPHEN 500 MG PO TABS
ORAL_TABLET | ORAL | Status: AC
  Filled 2023-03-15: qty 2

## 2023-03-15 MED ORDER — GABAPENTIN 300 MG PO CAPS
ORAL_CAPSULE | ORAL | Status: AC
  Filled 2023-03-15: qty 1

## 2023-03-15 MED ORDER — FENTANYL CITRATE (PF) 100 MCG/2ML IJ SOLN
INTRAMUSCULAR | Status: AC
  Filled 2023-03-15: qty 2

## 2023-03-15 MED ORDER — LABETALOL HCL 5 MG/ML IV SOLN
INTRAVENOUS | Status: DC | PRN
  Administered 2023-03-15: 5 mg via INTRAVENOUS

## 2023-03-15 MED ORDER — ONDANSETRON HCL 4 MG/2ML IJ SOLN
INTRAMUSCULAR | Status: DC | PRN
  Administered 2023-03-15: 4 mg via INTRAVENOUS

## 2023-03-15 MED ORDER — MIDAZOLAM HCL 2 MG/2ML IJ SOLN
INTRAMUSCULAR | Status: AC
  Filled 2023-03-15: qty 2

## 2023-03-15 MED ORDER — LIDOCAINE HCL (PF) 2 % IJ SOLN
INTRAMUSCULAR | Status: DC | PRN
  Administered 2023-03-15: 100 mg via INTRADERMAL

## 2023-03-15 MED ORDER — CHLORHEXIDINE GLUCONATE 0.12 % MT SOLN
OROMUCOSAL | Status: AC
  Filled 2023-03-15: qty 15

## 2023-03-15 MED ORDER — DEXAMETHASONE SODIUM PHOSPHATE 10 MG/ML IJ SOLN
INTRAMUSCULAR | Status: DC | PRN
  Administered 2023-03-15: 10 mg via INTRAVENOUS

## 2023-03-15 MED ORDER — BUPIVACAINE LIPOSOME 1.3 % IJ SUSP
INTRAMUSCULAR | Status: AC
  Filled 2023-03-15: qty 20

## 2023-03-15 MED ORDER — BUPIVACAINE-EPINEPHRINE 0.5% -1:200000 IJ SOLN
INTRAMUSCULAR | Status: DC | PRN
  Administered 2023-03-15: 30 mL

## 2023-03-15 MED ORDER — SUGAMMADEX SODIUM 200 MG/2ML IV SOLN
INTRAVENOUS | Status: DC | PRN
  Administered 2023-03-15: 200 mg via INTRAVENOUS

## 2023-03-15 MED ORDER — OXYCODONE HCL 5 MG PO TABS: 5.0000 mg | ORAL_TABLET | ORAL | 0 refills | Status: DC | PRN

## 2023-03-15 MED ORDER — SEVOFLURANE IN SOLN
RESPIRATORY_TRACT | Status: AC
  Filled 2023-03-15: qty 250

## 2023-03-15 MED ORDER — DEXMEDETOMIDINE HCL IN NACL 80 MCG/20ML IV SOLN
INTRAVENOUS | Status: DC | PRN
  Administered 2023-03-15: 4 ug via INTRAVENOUS
  Administered 2023-03-15: 8 ug via INTRAVENOUS

## 2023-03-15 MED ORDER — BUPIVACAINE-EPINEPHRINE (PF) 0.5% -1:200000 IJ SOLN
INTRAMUSCULAR | Status: AC
  Filled 2023-03-15: qty 30

## 2023-03-15 MED ORDER — SODIUM CHLORIDE FLUSH 0.9 % IV SOLN
INTRAVENOUS | Status: AC
  Filled 2023-03-15: qty 30

## 2023-03-15 MED ORDER — IBUPROFEN 600 MG PO TABS: 600.0000 mg | ORAL_TABLET | Freq: Three times a day (TID) | ORAL | 1 refills | Status: DC | PRN

## 2023-03-15 SURGICAL SUPPLY — 53 items
CANNULA CAP OBTURATR AIRSEAL 8 (CAP) IMPLANT
COVER TIP SHEARS 8 DVNC (MISCELLANEOUS) ×2 IMPLANT
COVER WAND RF STERILE (DRAPES) ×2 IMPLANT
DERMABOND ADVANCED .7 DNX12 (GAUZE/BANDAGES/DRESSINGS) ×2 IMPLANT
DRAPE ARM DVNC X/XI (DISPOSABLE) ×6 IMPLANT
DRAPE COLUMN DVNC XI (DISPOSABLE) ×2 IMPLANT
ELECT CAUTERY BLADE TIP 2.5 (TIP) ×2
ELECT REM PT RETURN 9FT ADLT (ELECTROSURGICAL) ×2
ELECTRODE CAUTERY BLDE TIP 2.5 (TIP) ×2 IMPLANT
ELECTRODE REM PT RTRN 9FT ADLT (ELECTROSURGICAL) ×2 IMPLANT
FORCEPS BPLR R/ABLATION 8 DVNC (INSTRUMENTS) ×2 IMPLANT
GLOVE SURG SYN 7.0 (GLOVE) ×8
GLOVE SURG SYN 7.0 PF PI (GLOVE) ×4 IMPLANT
GLOVE SURG SYN 7.5 E (GLOVE) ×8
GLOVE SURG SYN 7.5 PF PI (GLOVE) ×4 IMPLANT
GOWN STRL REUS W/ TWL LRG LVL3 (GOWN DISPOSABLE) ×6 IMPLANT
GRASPER SUT TROCAR 14GX15 (MISCELLANEOUS) ×2 IMPLANT
IRRIGATION STRYKERFLOW (MISCELLANEOUS) IMPLANT
IRRIGATOR STRYKERFLOW (MISCELLANEOUS)
IV NS 1000ML BAXH (IV SOLUTION) IMPLANT
KIT PINK PAD W/HEAD ARE REST (MISCELLANEOUS) ×2
KIT PINK PAD W/HEAD ARM REST (MISCELLANEOUS) ×2 IMPLANT
LABEL OR SOLS (LABEL) ×2 IMPLANT
MANIFOLD NEPTUNE II (INSTRUMENTS) ×2 IMPLANT
MESH VENT LT ST 15CM CRL ECHO2 (Mesh General) IMPLANT
NDL DRIVE SUT CUT DVNC (INSTRUMENTS) ×2 IMPLANT
NDL HYPO 22X1.5 SAFETY MO (MISCELLANEOUS) ×2 IMPLANT
NDL INSUFFLATION 14GA 120MM (NEEDLE) ×2 IMPLANT
NEEDLE DRIVE SUT CUT DVNC (INSTRUMENTS) ×2
NEEDLE HYPO 22X1.5 SAFETY MO (MISCELLANEOUS) ×2
NEEDLE INSUFFLATION 14GA 120MM (NEEDLE) ×2
OBTURATOR OPTICAL STND 8 DVNC (TROCAR) ×2
OBTURATOR OPTICALSTD 8 DVNC (TROCAR) ×2 IMPLANT
PACK LAP CHOLECYSTECTOMY (MISCELLANEOUS) ×2 IMPLANT
SCISSORS MNPLR CVD DVNC XI (INSTRUMENTS) ×2 IMPLANT
SEAL UNIV 5-12 XI (MISCELLANEOUS) ×4 IMPLANT
SET TUBE FILTERED XL AIRSEAL (SET/KITS/TRAYS/PACK) IMPLANT
SET TUBE SMOKE EVAC HIGH FLOW (TUBING) ×2 IMPLANT
SOL ELECTROSURG ANTI STICK (MISCELLANEOUS) ×2
SOLUTION ELECTROSURG ANTI STCK (MISCELLANEOUS) ×2 IMPLANT
SPONGE T-LAP 18X18 ~~LOC~~+RFID (SPONGE) ×2 IMPLANT
SUT MNCRL 4-0 27XMFL (SUTURE) ×2
SUT STRATA 2-0 30 CT-2 (SUTURE) ×4 IMPLANT
SUT STRATAFIX PDS 30 CT-1 (SUTURE) ×2 IMPLANT
SUT VIC AB 3-0 SH 27X BRD (SUTURE) IMPLANT
SUT VICRYL 0 UR6 27IN ABS (SUTURE) ×4 IMPLANT
SUTURE MNCRL 4-0 27XMF (SUTURE) ×2 IMPLANT
SYS BAG RETRIEVAL 10MM (BASKET) ×2
SYSTEM BAG RETRIEVAL 10MM (BASKET) IMPLANT
TAPE TRANSPORE STRL 2 31045 (GAUZE/BANDAGES/DRESSINGS) ×2 IMPLANT
TRAP FLUID SMOKE EVACUATOR (MISCELLANEOUS) ×2 IMPLANT
TRAY FOLEY SLVR 16FR LF STAT (SET/KITS/TRAYS/PACK) ×2 IMPLANT
WATER STERILE IRR 500ML POUR (IV SOLUTION) ×2 IMPLANT

## 2023-03-15 NOTE — Op Note (Signed)
Procedure Date:  03/15/2023  Pre-operative Diagnosis:  Umbilical hernia and diastasis recti  Post-operative Diagnosis: Reducible umbilical hernia, 1 cm size, and diastasis recti, widest separation 3.5 cm.  Procedure:  Robotic assisted Umbilical Hernia Repair with mesh and plication of diastasis recti.  Surgeon:  Howie Ill, MD  Anesthesia:  General endotracheal  Estimated Blood Loss:  10 ml  Specimens:  None  Complications:  None  Indications for Procedure:  This is a 47 y.o. male who presents with an umbilical hernia and diastasis recti.  The options of surgery versus observation were reviewed with the patient and/or family. The risks of bleeding, abscess or infection, recurrence of symptoms, potential for an open procedure, injury to surrounding structures, and chronic pain were all discussed with the patient and was willing to proceed.  Description of Procedure: The patient was correctly identified in the preoperative area and brought into the operating room.  The patient was placed supine with VTE prophylaxis in place.  Appropriate time-outs were performed.  Anesthesia was induced and the patient was intubated.  Appropriate antibiotics were infused.  The abdomen was prepped and draped in a sterile fashion. The patient's hernia defect was marked with a marking pen.  A Veress needle was introduced in the left upper quadrant and pneumoperitoneum was obtained with appropriate pressures.  Using Optiview technique, an 8 mm port was introduced in the left lateral abdominal wall without complications.  Then, a 12 mm port was introduced in the left upper quadrant and an 8 mm port in the left lower quadrant under direct visualization.  The DaVinci platform was docked, camera targeted, and instruments placed under direct visualization.  The patient's hernia was fully reduced and the peritoneum and preperitoneal fat were dissected and resected to allow better exposure of the hernia defect  and diastasis and for better mesh placement.  The hernia defect measured 1 cm, and the diastasis had a separation of about 3.5 cm at its widest point.  A 4 x 6 inch Bard Ventralight ST Echo mesh, a 0 Stratafix suture, and three 2-0 V-loc sutures were inserted through the 12 mm port under direct visualization. The hernia defect was closed using the stratafix suture while plicating the length of his diastasis recti.  A PMI was brought through the center of the hernia defect and the positioning system of the mesh was passed through.  This allowed the mesh to splay open and be centered over the repair site with good overlap.  The mesh was then sutured in place circumferentially and through the center of the mesh using the V-loc sutures.  All needles and the positioning system were then removed through the 12 mm port without complications.  The preperitoneal fat was placed in an Endocatch bag.  The DaVinci platform was then undocked and instruments removed.    80 ml of Exparel solution mixed with 0.5% bupivacaine with epi and saline was infiltrated around the mesh edges, hernia repair site, and port sites.  The 12 mm port was removed and endocatch bag retrieved.  The fascia was closed under direct visualization utilizing an Endo Close technique with 0 Vicryl suture.  The 8 mm ports were removed. The 12 mm incision was closed using 3-0 Vicryl and 4-0 Monocryl, and the other port incisions were closed with 4-0 Monocryl.  The wounds were cleaned and sealed with DermaBond.  The patient was emerged from anesthesia and extubated and brought to the recovery room for further management.  The patient tolerated the procedure  well and all counts were correct at the end of the case.   Howie Ill, MD

## 2023-03-15 NOTE — Transfer of Care (Signed)
Immediate Anesthesia Transfer of Care Note  Patient: MAXXIMUS BOEDING  Procedure(s) Performed: XI ROBOTIC ASSISTED UMBILICAL REPAIR HERNIA with plication of diastasis recti (Abdomen) INSERTION OF MESH (Abdomen)  Patient Location: PACU  Anesthesia Type:General  Level of Consciousness: awake, alert , and oriented  Airway & Oxygen Therapy: Patient Spontanous Breathing  Post-op Assessment: Report given to RN and Post -op Vital signs reviewed and stable  Post vital signs: stable  Last Vitals:  Vitals Value Taken Time  BP 134/77 03/15/23 0955  Temp    Pulse 62 03/15/23 0957  Resp 16 03/15/23 0957  SpO2 95 % 03/15/23 0957  Vitals shown include unfiled device data.  Last Pain:  Vitals:   03/15/23 0629  TempSrc: Oral  PainSc: 1          Complications: No notable events documented.

## 2023-03-15 NOTE — Anesthesia Procedure Notes (Signed)
Procedure Name: Intubation Date/Time: 03/15/2023 7:52 AM  Performed by: Maryla Morrow., CRNAPre-anesthesia Checklist: Patient identified, Patient being monitored, Timeout performed, Emergency Drugs available and Suction available Patient Re-evaluated:Patient Re-evaluated prior to induction Oxygen Delivery Method: Circle system utilized Preoxygenation: Pre-oxygenation with 100% oxygen Induction Type: IV induction Ventilation: Mask ventilation without difficulty Laryngoscope Size: 3 and McGrath Grade View: Grade I Tube type: Oral Tube size: 7.0 mm Number of attempts: 1 Airway Equipment and Method: Stylet Placement Confirmation: ETT inserted through vocal cords under direct vision, positive ETCO2 and breath sounds checked- equal and bilateral Secured at: 19 cm Tube secured with: Tape Dental Injury: Teeth and Oropharynx as per pre-operative assessment

## 2023-03-15 NOTE — Anesthesia Postprocedure Evaluation (Signed)
Anesthesia Post Note  Patient: Justin Short  Procedure(s) Performed: XI ROBOTIC ASSISTED UMBILICAL REPAIR HERNIA with plication of diastasis recti (Abdomen) INSERTION OF MESH (Abdomen)  Patient location during evaluation: PACU Anesthesia Type: General Level of consciousness: awake and alert Pain management: pain level controlled Vital Signs Assessment: post-procedure vital signs reviewed and stable Respiratory status: spontaneous breathing, nonlabored ventilation, respiratory function stable and patient connected to nasal cannula oxygen Cardiovascular status: blood pressure returned to baseline and stable Postop Assessment: no apparent nausea or vomiting Anesthetic complications: no  There were no known notable events for this encounter.   Last Vitals:  Vitals:   03/15/23 1030 03/15/23 1045  BP: 115/78 110/74  Pulse: 60 (!) 57  Resp: 16 18  Temp:  (!) 36.1 C  SpO2: 96% 95%    Last Pain:  Vitals:   03/15/23 1045  TempSrc:   PainSc: 0-No pain                 Stephanie Coup

## 2023-03-15 NOTE — Interval H&P Note (Signed)
History and Physical Interval Note:  03/15/2023 7:08 AM  Justin Short  has presented today for surgery, with the diagnosis of Umbilical hernia reducible, less than 3 cm with plication of diastasis recti.  The various methods of treatment have been discussed with the patient and family. After consideration of risks, benefits and other options for treatment, the patient has consented to  Procedure(s): XI ROBOTIC ASSISTED UMBILICAL REPAIR HERNIA with plication of diastasis recti (N/A) as a surgical intervention.  The patient's history has been reviewed, patient examined, no change in status, stable for surgery.  I have reviewed the patient's chart and labs.  Questions were answered to the patient's satisfaction.     Dierre Crevier

## 2023-03-15 NOTE — Discharge Instructions (Signed)
Discharge Instructions: 1.  Patient may shower, but do not scrub wounds heavily and dab dry only. 2.  Do not submerge wounds in pool/tub until fully healed. 3.  Do not apply ointments or hydrogen peroxide to the wounds. 4.  May apply ice packs to the wounds for comfort. 5.  May take Miralax once daily for constipation. 6.  Do not drive while taking narcotics for pain control.  Prior to driving, make sure you are able to rotate right and left to look at blindspots without significant pain or discomfort. 7.  No heavy lifting or pushing of more than 10-15 lbs for 6 weeks.

## 2023-03-15 NOTE — Anesthesia Preprocedure Evaluation (Signed)
Anesthesia Evaluation  Patient identified by MRN, date of birth, ID band Patient awake    Reviewed: Allergy & Precautions, NPO status , Patient's Chart, lab work & pertinent test results  Airway Mallampati: III  TM Distance: >3 FB Neck ROM: full    Dental  (+) Chipped, Dental Advidsory Given   Pulmonary neg pulmonary ROS, Current SmokerPatient did not abstain from smoking.   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)but when he went back to pcp said it looks as               though pt has both functioning kidneys  Past Surgical History: 04/11/2021: EVALUATION UNDER ANESTHESIA WITH HEMORRHOIDECTOMY; N/A     Comment:  Procedure: EXAM UNDER ANESTHESIA WITH HEMORRHOIDECTOMY;               Surgeon: Henrene Dodge, MD;  Location: [facility] ORS;                Service: General;  Laterality: N/A; No date: GANGLION CYST EXCISION; Left     Comment:  pinky finger 2006: sphincterotomy     Comment:  lateral internal sphincterotomy for anal fissure  BMI    Body Mass Index: 24.03 kg/m      Reproductive/Obstetrics negative OB ROS                             Anesthesia Physical Anesthesia Plan  ASA: 2  Anesthesia Plan: General ETT and General   Post-op Pain Management:    Induction: Intravenous  PONV  Risk Score and Plan: 2 and Ondansetron, Dexamethasone and Midazolam  Airway Management Planned: Oral ETT  Additional Equipment:   Intra-op Plan:   Post-operative Plan: Extubation in OR  Informed Consent: I have reviewed the patients History and Physical, chart, labs and discussed the procedure including the risks, benefits and alternatives for the proposed anesthesia with the patient or authorized representative who has indicated his/her understanding and acceptance.     Dental Advisory Given  Plan Discussed with: Anesthesiologist, CRNA and Surgeon  Anesthesia Plan Comments: (Patient consented for risks of anesthesia including but not limited to:  - adverse reactions to medications - damage to eyes, teeth, lips or other oral mucosa - nerve damage due to positioning  - sore throat or hoarseness - Damage to heart, brain, nerves, lungs, other parts of body or loss of life  Patient voiced understanding and assent.)       Anesthesia Quick Evaluation

## 2023-03-16 ENCOUNTER — Encounter: Payer: Self-pay | Admitting: Surgery

## 2023-03-24 ENCOUNTER — Inpatient Hospital Stay
Admission: EM | Admit: 2023-03-24 | Discharge: 2023-03-27 | DRG: 378 | Disposition: A | Discharge status: Home / Self Care | Payer: 59 | Attending: Internal Medicine | Admitting: Internal Medicine

## 2023-03-24 ENCOUNTER — Emergency Department: Payer: 59

## 2023-03-24 ENCOUNTER — Other Ambulatory Visit: Payer: Self-pay

## 2023-03-24 ENCOUNTER — Encounter: Payer: Self-pay | Admitting: Emergency Medicine

## 2023-03-24 ENCOUNTER — Ambulatory Visit
Admission: EM | Admit: 2023-03-24 | Discharge: 2023-03-24 | Disposition: A | Discharge status: Other Acute Inpatient Hospital | Payer: 59 | Attending: Family Medicine | Admitting: Family Medicine

## 2023-03-24 DIAGNOSIS — Z833 Family history of diabetes mellitus: Secondary | ICD-10-CM | POA: Diagnosis not present

## 2023-03-24 DIAGNOSIS — D649 Anemia, unspecified: Secondary | ICD-10-CM

## 2023-03-24 DIAGNOSIS — Z808 Family history of malignant neoplasm of other organs or systems: Secondary | ICD-10-CM | POA: Diagnosis not present

## 2023-03-24 DIAGNOSIS — F17210 Nicotine dependence, cigarettes, uncomplicated: Secondary | ICD-10-CM | POA: Diagnosis present

## 2023-03-24 DIAGNOSIS — M1990 Unspecified osteoarthritis, unspecified site: Secondary | ICD-10-CM | POA: Diagnosis present

## 2023-03-24 DIAGNOSIS — F17290 Nicotine dependence, other tobacco product, uncomplicated: Secondary | ICD-10-CM | POA: Diagnosis present

## 2023-03-24 DIAGNOSIS — Z8249 Family history of ischemic heart disease and other diseases of the circulatory system: Secondary | ICD-10-CM

## 2023-03-24 DIAGNOSIS — R000 Tachycardia, unspecified: Secondary | ICD-10-CM

## 2023-03-24 DIAGNOSIS — Z8719 Personal history of other diseases of the digestive system: Secondary | ICD-10-CM | POA: Diagnosis not present

## 2023-03-24 DIAGNOSIS — L819 Disorder of pigmentation, unspecified: Secondary | ICD-10-CM | POA: Diagnosis not present

## 2023-03-24 DIAGNOSIS — D62 Acute posthemorrhagic anemia: Secondary | ICD-10-CM

## 2023-03-24 DIAGNOSIS — Z823 Family history of stroke: Secondary | ICD-10-CM | POA: Diagnosis not present

## 2023-03-24 DIAGNOSIS — K264 Chronic or unspecified duodenal ulcer with hemorrhage: Principal | ICD-10-CM | POA: Diagnosis present

## 2023-03-24 DIAGNOSIS — E861 Hypovolemia: Secondary | ICD-10-CM | POA: Diagnosis present

## 2023-03-24 DIAGNOSIS — Z8051 Family history of malignant neoplasm of kidney: Secondary | ICD-10-CM

## 2023-03-24 DIAGNOSIS — Z841 Family history of disorders of kidney and ureter: Secondary | ICD-10-CM | POA: Diagnosis not present

## 2023-03-24 DIAGNOSIS — K922 Gastrointestinal hemorrhage, unspecified: Secondary | ICD-10-CM | POA: Diagnosis present

## 2023-03-24 DIAGNOSIS — Z9889 Other specified postprocedural states: Secondary | ICD-10-CM | POA: Diagnosis not present

## 2023-03-24 DIAGNOSIS — K429 Umbilical hernia without obstruction or gangrene: Secondary | ICD-10-CM | POA: Diagnosis present

## 2023-03-24 DIAGNOSIS — Q600 Renal agenesis, unilateral: Secondary | ICD-10-CM | POA: Diagnosis not present

## 2023-03-24 DIAGNOSIS — E871 Hypo-osmolality and hyponatremia: Secondary | ICD-10-CM | POA: Insufficient documentation

## 2023-03-24 LAB — FERRITIN: Ferritin: 125 ng/mL (ref 24–336)

## 2023-03-24 LAB — HEMOGLOBIN AND HEMATOCRIT, BLOOD
HCT: 22.6 % — ABNORMAL LOW (ref 39.0–52.0)
Hemoglobin: 8.1 g/dL — ABNORMAL LOW (ref 13.0–17.0)

## 2023-03-24 LAB — CBC
HCT: 18.2 % — ABNORMAL LOW (ref 39.0–52.0)
Hemoglobin: 6.3 g/dL — ABNORMAL LOW (ref 13.0–17.0)
MCH: 31.3 pg (ref 26.0–34.0)
MCHC: 34.6 g/dL (ref 30.0–36.0)
MCV: 90.5 fL (ref 80.0–100.0)
Platelets: 405 10*3/uL — ABNORMAL HIGH (ref 150–400)
RBC: 2.01 MIL/uL — ABNORMAL LOW (ref 4.22–5.81)
RDW: 12.2 % (ref 11.5–15.5)
WBC: 12.6 10*3/uL — ABNORMAL HIGH (ref 4.0–10.5)
nRBC: 0 % (ref 0.0–0.2)

## 2023-03-24 LAB — COMPREHENSIVE METABOLIC PANEL
ALT: 54 U/L — ABNORMAL HIGH (ref 0–44)
AST: 32 U/L (ref 15–41)
Albumin: 2.8 g/dL — ABNORMAL LOW (ref 3.5–5.0)
Alkaline Phosphatase: 55 U/L (ref 38–126)
Anion gap: 10 (ref 5–15)
BUN: 25 mg/dL — ABNORMAL HIGH (ref 6–20)
CO2: 23 mmol/L (ref 22–32)
Calcium: 8.2 mg/dL — ABNORMAL LOW (ref 8.9–10.3)
Chloride: 94 mmol/L — ABNORMAL LOW (ref 98–111)
Creatinine, Ser: 0.95 mg/dL (ref 0.61–1.24)
GFR, Estimated: >60 mL/min (ref >60)
Glucose, Bld: 143 mg/dL — ABNORMAL HIGH (ref 70–99)
Potassium: 3.7 mmol/L (ref 3.5–5.1)
Sodium: 127 mmol/L — ABNORMAL LOW (ref 135–145)
Total Bilirubin: 0.5 mg/dL (ref <1.2)
Total Protein: 5.5 g/dL — ABNORMAL LOW (ref 6.5–8.1)

## 2023-03-24 LAB — VITAMIN B12: Vitamin B-12: 169 pg/mL — ABNORMAL LOW (ref 180–914)

## 2023-03-24 LAB — HIV ANTIBODY (ROUTINE TESTING W REFLEX): HIV Screen 4th Generation wRfx: NONREACTIVE

## 2023-03-24 LAB — RETICULOCYTES
Immature Retic Fract: 21.6 % — ABNORMAL HIGH (ref 2.3–15.9)
RBC.: 1.71 MIL/uL — ABNORMAL LOW (ref 4.22–5.81)
Retic Count, Absolute: 55.7 10*3/uL (ref 19.0–186.0)
Retic Ct Pct: 3.3 % — ABNORMAL HIGH (ref 0.4–3.1)

## 2023-03-24 LAB — PREPARE RBC (CROSSMATCH)

## 2023-03-24 LAB — IRON AND TIBC
Iron: 103 ug/dL (ref 45–182)
Saturation Ratios: 38 % (ref 17.9–39.5)
TIBC: 272 ug/dL (ref 250–450)
UIBC: 169 ug/dL

## 2023-03-24 LAB — SODIUM
Sodium: 129 mmol/L — ABNORMAL LOW (ref 135–145)
Sodium: 130 mmol/L — ABNORMAL LOW (ref 135–145)

## 2023-03-24 LAB — FOLATE: Folate: 14.3 ng/mL (ref >5.9)

## 2023-03-24 LAB — ABO/RH: ABO/RH(D): O POS

## 2023-03-24 MED ORDER — IOHEXOL 350 MG/ML SOLN
80.0000 mL | Freq: Once | INTRAVENOUS | Status: AC | PRN
  Administered 2023-03-24: 80 mL via INTRAVENOUS

## 2023-03-24 MED ORDER — LORAZEPAM 2 MG/ML IJ SOLN
0.5000 mg | INTRAMUSCULAR | Status: DC | PRN
  Administered 2023-03-27: 0.5 mg via INTRAVENOUS
  Filled 2023-03-24 (×2): qty 1

## 2023-03-24 MED ORDER — ONDANSETRON HCL 4 MG/2ML IJ SOLN
4.0000 mg | Freq: Four times a day (QID) | INTRAMUSCULAR | Status: DC | PRN
  Filled 2023-03-24: qty 2

## 2023-03-24 MED ORDER — SODIUM CHLORIDE 0.9 % IV BOLUS
1000.0000 mL | Freq: Once | INTRAVENOUS | Status: AC
  Administered 2023-03-24: 1000 mL via INTRAVENOUS

## 2023-03-24 MED ORDER — HYDROMORPHONE HCL 1 MG/ML IJ SOLN
0.5000 mg | INTRAMUSCULAR | Status: DC | PRN
  Administered 2023-03-26: 0.5 mg via INTRAVENOUS
  Filled 2023-03-24: qty 0.5

## 2023-03-24 MED ORDER — DEXTROSE-SODIUM CHLORIDE 5-0.45 % IV SOLN: INTRAVENOUS | Status: DC

## 2023-03-24 MED ORDER — PANTOPRAZOLE SODIUM 40 MG IV SOLR
40.0000 mg | Freq: Two times a day (BID) | INTRAVENOUS | Status: DC
Start: 2023-03-24 — End: 2023-03-27
  Administered 2023-03-24 – 2023-03-27 (×7): 40 mg via INTRAVENOUS
  Filled 2023-03-24 (×7): qty 10

## 2023-03-24 MED ORDER — PANTOPRAZOLE SODIUM 40 MG IV SOLR
80.0000 mg | Freq: Once | INTRAVENOUS | Status: AC
  Administered 2023-03-24: 80 mg via INTRAVENOUS
  Filled 2023-03-24: qty 20

## 2023-03-24 MED ORDER — SODIUM CHLORIDE 0.9 % IV SOLN: INTRAVENOUS | Status: AC

## 2023-03-24 MED ORDER — SODIUM CHLORIDE 0.9 % IV SOLN: 10.0000 mL/h | Freq: Once | INTRAVENOUS | Status: DC

## 2023-03-24 MED ORDER — ONDANSETRON HCL 4 MG PO TABS
4.0000 mg | ORAL_TABLET | Freq: Four times a day (QID) | ORAL | Status: DC | PRN
Start: 2023-03-24 — End: 2023-03-27

## 2023-03-24 NOTE — ED Notes (Signed)
This RN chaperoned MD at bedside for a fecal occult test at this time.

## 2023-03-24 NOTE — ED Provider Notes (Signed)
[facility]    CSN: [id number] Arrival date & time: 03/24/23  1050      History   Chief Complaint Chief Complaint  Patient presents with   Tachycardia    HPI TARA MALISZEWSKI is a 47 y.o. male.   HPI  47 year old male with a past medical history significant for anal fissure, hemorrhoids, and kidney disease presents for evaluation of tachycardia and shortness of breath.  He reports that his symptoms began last night after having a large bowel movement that he describes as black like sludge out of an engine.  The week prior he had laparoscopic surgery to repair a hernia and diastases recti.  He reports that he has had 2 black stools and until the passage of the large black stool causing tachycardia yesterday he was experiencing abdominal pain.  Patient does endorse dizziness every time he stands up but he has not had any syncopal events.  Past Medical History:  Diagnosis Date   Anal fissure    Anxiety    no meds   Arthritis    Hemorrhoids    always present   Kidney disease    pt reports only one functional kidney (dx in 6th grade)but when he went back to pcp said it looks as though pt has both functioning kidneys    Patient Active Problem List   Diagnosis Date Noted   Umbilical hernia without obstruction and without gangrene 03/15/2023   Diastasis recti 03/15/2023   Anxiety and depression 10/12/2020    Past Surgical History:  Procedure Laterality Date   EVALUATION UNDER ANESTHESIA WITH HEMORRHOIDECTOMY N/A 04/11/2021   Procedure: EXAM UNDER ANESTHESIA WITH HEMORRHOIDECTOMY;  Surgeon: Henrene Dodge, MD;  Location: [facility] ORS;  Service: General;  Laterality: N/A;   GANGLION CYST EXCISION Left    pinky finger   INSERTION OF MESH  03/15/2023   Procedure: INSERTION OF MESH;  Surgeon: Henrene Dodge, MD;  Location: [facility] OR[facility];  Service: General;;  Umbilical   sphincterotomy  2006   lateral internal sphincterotomy for anal fissure   XI ROBOTIC ASSISTED VENTRAL  HERNIA N/A 03/15/2023   Procedure: XI ROBOTIC ASSISTED UMBILICAL REPAIR HERNIA with plication of diastasis recti;  Surgeon: Henrene Dodge, MD;  Location: [facility] ORS;  Service: General;  Laterality: N/A;  umbilical       Home Medications    Prior to Admission medications   Medication Sig Start Date End Date Taking? Authorizing Provider  acetaminophen (TYLENOL) 500 MG tablet Take 2 tablets (1,000 mg total) by mouth every 6 (six) hours as needed for mild pain (pain score 1-3). 03/15/23   Henrene Dodge, MD  ibuprofen (ADVIL) 600 MG tablet Take 1 tablet (600 mg total) by mouth every 8 (eight) hours as needed for moderate pain (pain score 4-6). 03/15/23   Henrene Dodge, MD  oxyCODONE (OXY IR/ROXICODONE) 5 MG immediate release tablet Take 1 tablet (5 mg total) by mouth every 4 (four) hours as needed for severe pain (pain score 7-10). 03/15/23   Piscoya, Elita Quick, MD  psyllium (METAMUCIL) 58.6 % powder Take 1 packet by mouth daily.    [provider]    Family History Family History  Problem Relation Age of Onset   Cancer Mother        skin   Diabetes Father    Heart disease Father        CHF   Cancer Father        renal   Stroke Father    Kidney disease Father  dialysis s/p kidney surgery ? cancer   Thyroid disease Paternal Aunt     Social History Social History   Tobacco Use   Smoking status: Some Days    Current packs/day: 0.50    Average packs/day: 0.5 packs/day for 22.4 years (11.2 ttl pk-yrs)    Types: Cigarettes, Cigars    Start date: 03/28/2020    Passive exposure: Past   Smokeless tobacco: Never  Vaping Use   Vaping status: Every Day   Substances: Nicotine, THC, Flavoring  Substance Use Topics   Alcohol use: Yes    Comment: occ   Drug use: Yes    Frequency: 10.0 times per week    Types: Marijuana    Comment: smokes small amount each occurrence 2-3 g / week     Allergies   Patient has no known allergies.   Review of Systems Review of Systems   Cardiovascular:  Negative for chest pain.       Tachycardia  Gastrointestinal:  Positive for abdominal pain.       Black stools     Physical Exam Triage Vital Signs ED Triage Vitals [03/24/23 1053]  Encounter Vitals Group     BP 138/87     Systolic BP Percentile      Diastolic BP Percentile      Pulse Rate (!) 153     Resp 16     Temp      Temp src      SpO2 97 %     Weight 139 lb 15.9 oz (63.5 kg)     Height 5\' 4"  (1.626 m)     Head Circumference      Peak Flow      Pain Score 0     Pain Loc      Pain Education      Exclude from Growth Chart    No data found.  Updated Vital Signs BP 138/87 (BP Location: Left Arm)   Pulse (!) 153   Resp 16   Ht 5\' 4"  (1.626 m)   Wt 139 lb 15.9 oz (63.5 kg)   SpO2 97%   BMI 24.03 kg/m   Visual Acuity Right Eye Distance:   Left Eye Distance:   Bilateral Distance:    Right Eye Near:   Left Eye Near:    Bilateral Near:     Physical Exam Vitals and nursing note reviewed.  Constitutional:      General: He is in acute distress.  Eyes:     Extraocular Movements: Extraocular movements intact.     Pupils: Pupils are equal, round, and reactive to light.     Comments: Conjunctiva are markedly pale.  Cardiovascular:     Rate and Rhythm: Regular rhythm. Tachycardia present.     Pulses: Normal pulses.     Heart sounds: Normal heart sounds. No murmur heard.    No friction rub. No gallop.  Pulmonary:     Effort: Pulmonary effort is normal.     Breath sounds: Normal breath sounds. No wheezing, rhonchi or rales.  Abdominal:     General: Abdomen is flat.     Palpations: Abdomen is soft.     Tenderness: There is abdominal tenderness. There is no guarding or rebound.  Skin:    Coloration: Skin is pale.  Neurological:     Mental Status: He is alert.      UC Treatments / Results  Labs (all labs ordered are listed, but only abnormal results are displayed) Labs Reviewed -  No data to display  EKG Sinus tachycardia with  ventricular rate of 126 bpm Peer interval 120 ms QRS duration 70 ms QT/QTc 304/440 ms No ST or T wave abnormalities noted.   Radiology No results found.  Procedures Procedures (including critical care time)  Medications Ordered in UC Medications - No data to display  Initial Impression / Assessment and Plan / UC Course  I have reviewed the triage vital signs and the nursing notes.  Pertinent labs & imaging results that were available during my care of the patient were reviewed by me and considered in my medical decision making (see chart for details).   Patient is a pleasant 47 year old gentleman presenting for evaluation of tachycardia that started last night after having a large bowel movement that was positive for black, tarry stool.  This bowel movement was 8 days status post robotic surgery for abdominal hernia repair and repair of diastases recti.  Patient is profoundly pale in the exam room.  He is tachycardic with heart rate of 155.  His blood pressure is normal at 138/87 and his oxygen saturation is 97%.  He is wearing an abdominal binder though I did remove it to reveal a abdomen that is flat with mild tenderness.  Unclear if this is secondary to recent surgery or more in-depth pathology.  Given the patient's tachycardia, pale complexion, recent surgery and passage of dark tarry stools I will have staff insert a peripheral IV, obtain a twelve-lead EKG, and we will transfer patient to [facility] where he had surgery by EMS.  Concern for bowel injury, or hemorrhage given the dark tarry stool and abdominal pain.  I called and gave report to Morrie Sheldon, the charge nurse in the ER at [facility].  EMS has been notified.  Report given to [facility].  Care transferred.   Final Clinical Impressions(s) / UC Diagnoses   Final diagnoses:  Tachycardia  Pale discoloration of skin of entire body     Discharge Instructions      As we discussed, there is concern for possible hemorrhage given  your pale complexion and fast heart rate.  This is further intensified by the fact that you are passing dark, tarry stools.  Please go to the emergency department at [facility] for further evaluation.     ED Prescriptions   None    PDMP not reviewed this encounter.   Becky Augusta, NP 03/24/23 1108

## 2023-03-24 NOTE — ED Notes (Signed)
Patient is being discharged from the Urgent Care and sent to the Emergency Department via EMS . Per Becky Augusta, NP, patient is in need of higher level of care due to Tachycardia and paleness. Patient is aware and verbalizes understanding of plan of care.  Vitals:   03/24/23 1053  BP: 138/87  Pulse: (!) 153  Resp: 16  SpO2: 97%

## 2023-03-24 NOTE — Consult Note (Signed)
Consultation  Referring Provider:   ED   Admit date: 03/24/2023 Consult date: 03/24/2023         Reason for Consultation: Melena/Anemia              HPI:   Justin Short is a 47 y.o. gentleman with history of hemorrhoids s/p hemorrhoidectomy, solitary kidney, and recent hernia repair (7 days ago) who presents with melena since Thursday. States he has been taking NSAIDS heavily since surgery and took them occasionally prior to surgery. Hemoglobin in June was 15 and hemoglobin here is 6.3. No blood thinners. No family history of GI malignancies. Endorses fatigue and melenic stool.   Past Medical History:  Diagnosis Date   Anal fissure    Anxiety    no meds   Arthritis    Hemorrhoids    always present   Kidney disease    pt reports only one functional kidney (dx in 6th grade)but when he went back to pcp said it looks as though pt has both functioning kidneys    Past Surgical History:  Procedure Laterality Date   EVALUATION UNDER ANESTHESIA WITH HEMORRHOIDECTOMY N/A 04/11/2021   Procedure: EXAM UNDER ANESTHESIA WITH HEMORRHOIDECTOMY;  Surgeon: Henrene Dodge, MD;  Location: [facility] ORS;  Service: General;  Laterality: N/A;   GANGLION CYST EXCISION Left    pinky finger   INSERTION OF MESH  03/15/2023   Procedure: INSERTION OF MESH;  Surgeon: Henrene Dodge, MD;  Location: [facility] OR[facility];  Service: General;;  Umbilical   sphincterotomy  2006   lateral internal sphincterotomy for anal fissure   XI ROBOTIC ASSISTED VENTRAL HERNIA N/A 03/15/2023   Procedure: XI ROBOTIC ASSISTED UMBILICAL REPAIR HERNIA with plication of diastasis recti;  Surgeon: Henrene Dodge, MD;  Location: [facility] ORS;  Service: General;  Laterality: N/A;  umbilical    Family History  Problem Relation Age of Onset   Cancer Mother        skin   Diabetes Father    Heart disease Father        CHF   Cancer Father        renal   Stroke Father    Kidney disease Father        dialysis s/p kidney surgery ? cancer   Thyroid  disease Paternal Aunt    Social History   Tobacco Use   Smoking status: Some Days    Current packs/day: 0.50    Average packs/day: 0.5 packs/day for 22.4 years (11.2 ttl pk-yrs)    Types: Cigarettes, Cigars    Start date: 03/28/2020    Passive exposure: Past   Smokeless tobacco: Never  Vaping Use   Vaping status: Every Day   Substances: Nicotine, THC, Flavoring  Substance Use Topics   Alcohol use: Yes    Comment: occ   Drug use: Yes    Frequency: 10.0 times per week    Types: Marijuana    Comment: smokes small amount each occurrence 2-3 g / week    Prior to Admission medications   Medication Sig Start Date End Date Taking? Authorizing Provider  acetaminophen (TYLENOL) 500 MG tablet Take 2 tablets (1,000 mg total) by mouth every 6 (six) hours as needed for mild pain (pain score 1-3). 03/15/23   Henrene Dodge, MD  ibuprofen (ADVIL) 600 MG tablet Take 1 tablet (600 mg total) by mouth every 8 (eight) hours as needed for moderate pain (pain score 4-6). 03/15/23   Henrene Dodge, MD  oxyCODONE (OXY IR/ROXICODONE) 5 MG immediate  release tablet Take 1 tablet (5 mg total) by mouth every 4 (four) hours as needed for severe pain (pain score 7-10). 03/15/23   Piscoya, Elita Quick, MD  psyllium (METAMUCIL) 58.6 % powder Take 1 packet by mouth daily.    [provider]    Current Facility-Administered Medications  Medication Dose Route Frequency Provider Last Rate Last Admin   0.9 %  sodium chloride infusion  10 mL/hr Intravenous Once Corena Herter, MD       Current Outpatient Medications  Medication Sig Dispense Refill   acetaminophen (TYLENOL) 500 MG tablet Take 2 tablets (1,000 mg total) by mouth every 6 (six) hours as needed for mild pain (pain score 1-3).     ibuprofen (ADVIL) 600 MG tablet Take 1 tablet (600 mg total) by mouth every 8 (eight) hours as needed for moderate pain (pain score 4-6). 60 tablet 1   oxyCODONE (OXY IR/ROXICODONE) 5 MG immediate release tablet Take 1 tablet (5  mg total) by mouth every 4 (four) hours as needed for severe pain (pain score 7-10). 30 tablet 0   psyllium (METAMUCIL) 58.6 % powder Take 1 packet by mouth daily.      Allergies as of 03/24/2023   (No Known Allergies)     Review of Systems:    All systems reviewed and negative except where noted in HPI.  Review of Systems  Constitutional:  Positive for malaise/fatigue. Negative for chills and fever.  Respiratory:  Negative for shortness of breath.   Cardiovascular:  Negative for chest pain.  Gastrointestinal:  Positive for melena. Negative for abdominal pain, nausea and vomiting.  Musculoskeletal:  Negative for joint pain.  Skin:  Negative for rash.  Neurological:  Negative for focal weakness.  Psychiatric/Behavioral:  Negative for substance abuse.   All other systems reviewed and are negative.      Physical Exam:  Vital signs in last 24 hours: Temp:  [98.8 F (37.1 C)] 98.8 F (37.1 C) (12/28 1212) Pulse Rate:  [100-153] 135 (12/28 1330) Resp:  [0-22] 22 (12/28 1330) BP: (115-138)/(76-89) 125/86 (12/28 1330) SpO2:  [97 %-100 %] 100 % (12/28 1330) Weight:  [59 kg-63.5 kg] 59 kg (12/28 1208)   General:   Pleasant in NAD Head:  Normocephalic and atraumatic. Eyes:   Pale conjunctiva Mouth: Mucosa pink moist, no lesions. Neck:  Supple; no masses felt Lungs: No respiratory distress Heart:  Tachy Abdomen:   Flat, soft, nondistended Msk:  No clubbing or cyanosis. Strength 5/5. Symmetrical without gross deformities. Neurologic:  Alert and  oriented x4;  No focal deficits Skin:  Warm, dry, pink without significant lesions or rashes. Psych:  Alert and cooperative. Normal affect.  LAB RESULTS: Recent Labs    03/24/23 1212  WBC 12.6*  HGB 6.3*  HCT 18.2*  PLT 405*   BMET Recent Labs    03/24/23 1212  NA 127*  K 3.7  CL 94*  CO2 23  GLUCOSE 143*  BUN 25*  CREATININE 0.95  CALCIUM 8.2*   LFT Recent Labs    03/24/23 1212  PROT 5.5*  ALBUMIN 2.8*  AST 32   ALT 54*  ALKPHOS 55  BILITOT 0.5   PT/INR No results for input(s): "LABPROT", "INR" in the last 72 hours.  STUDIES: CT ANGIO GI BLEED Result Date: 03/24/2023 CLINICAL DATA:  Recent umbilical hernia repair, possible GI bleed EXAM: CTA ABDOMEN AND PELVIS WITHOUT AND WITH CONTRAST TECHNIQUE: Multidetector CT imaging of the abdomen and pelvis was performed using the standard protocol during  bolus administration of intravenous contrast. Multiplanar reconstructed images and MIPs were obtained and reviewed to evaluate the vascular anatomy. RADIATION DOSE REDUCTION: This exam was performed according to the departmental dose-optimization program which includes automated exposure control, adjustment of the mA and/or kV according to patient size and/or use of iterative reconstruction technique. CONTRAST:  80mL OMNIPAQUE IOHEXOL 350 MG/ML SOLN COMPARISON:  None Available. FINDINGS: VASCULAR Aorta: Normal caliber aorta without aneurysm, dissection, vasculitis or significant stenosis. Celiac: Patent without evidence of aneurysm, dissection, vasculitis or significant stenosis. SMA: Patent without evidence of aneurysm, dissection, vasculitis or significant stenosis. Renals: The right renal artery is patent without evidence of aneurysm, dissection, vasculitis, fibromuscular dysplasia or significant stenosis. Dysplastic left renal artery. IMA: Patent without evidence of aneurysm, dissection, vasculitis or significant stenosis. Inflow: Patent without evidence of aneurysm, dissection, vasculitis or significant stenosis. Proximal Outflow: Bilateral common femoral and visualized portions of the superficial and profunda femoral arteries are patent without evidence of aneurysm, dissection, vasculitis or significant stenosis. Veins: No focal venous abnormality. Review of the MIP images confirms the above findings. NON-VASCULAR Lower chest: The intracardiac blood pool is hypodense relative to the adjacent myocardium consistent  with anemia. Otherwise, no acute cardiopulmonary process. Hepatobiliary: No focal liver abnormality is seen. No gallstones, gallbladder wall thickening, or biliary dilatation. Pancreas: Unremarkable. No pancreatic ductal dilatation or surrounding inflammatory changes. Spleen: Normal in size without focal abnormality. Adrenals/Urinary Tract: Adrenal glands are unremarkable. The right kidney is normal without renal calculi, focal lesion, or hydronephrosis. 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 Bladder is unremarkable. Stomach/Bowel: Stomach is within normal limits. Appendix appears normal. No evidence of bowel wall thickening, distention, or inflammatory changes. No evidence of contrast extravasation to suggest acute GI bleeding. Lymphatic: No suspicious lymphadenopathy. Reproductive: Prostate is unremarkable. Other: No abdominal wall hernia or abnormality. No abdominopelvic ascites. Postsurgical changes of umbilical hernia repair without evidence of complication. Musculoskeletal: No acute or significant osseous findings. IMPRESSION: 1. The intracardiac blood pool is hypodense relative to the adjacent myocardium consistent with anemia. 2. No evidence of active GI bleeding at this time. 3. Surgical changes of umbilical hernia repair without evidence of complication. 4. 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 Electronically Signed   By: Malachy Moan M.D.   On: 03/24/2023 13:42       Impression / Plan:   47 y/o gentleman with history of hemorrhoids s/p hemorrhoidectomy, solitary kidney, and recent hernia repair (7 days ago) who presents with melena since Thursday. Needs to be resuscitated before EGD.  - PPI IV BID - IV fluids - maintain active type and screen and transfuse to get hemoglobin > 7 - clear liquids, NPO at midnight - avoid heparin prophylaxis - Further recs after procedure tomorrow - monitor for any hemodynamically significant GI  bleeding  Merlyn Lot MD, MPH [facility] GI

## 2023-03-24 NOTE — Assessment & Plan Note (Addendum)
Recurrent black stools x 1 week with worsening weakness and fatigue as well as tachycardia in the setting of high-dose NSAID use status post surgical repair of umbilical hernia Hemoglobin 15-6.3 Pending PRBC transfusion HOLD NSAIDs IV PPI Dr. Mia Creek with GI at the bedside with plan for endoscopy in the morning Trend hemoglobin monitor

## 2023-03-24 NOTE — ED Notes (Signed)
First nurse note: From [place] [facility] via [facility]  Had hernia surgery last Thursday (here), noticed dark stool yesterday then this AM began having dizziness. Pallor noted by EMS 124 HR 112/78 20# R AC CBG was normal

## 2023-03-24 NOTE — ED Triage Notes (Signed)
Pt sent from urgent care in [place] for possible GI bleed. Pt states his heart is racing - started at 7pm yesterday; states he has had black stools x 2 days. Had surgery on 12/19 for hernia repair.

## 2023-03-24 NOTE — ED Triage Notes (Signed)
Patient states that last night he started having fast heartbeat and indigestion.  Patient had hernia repair a week ago.  Patient denies chest pain or SOB.  Patient reports passing dark black stools yesterday.

## 2023-03-24 NOTE — Assessment & Plan Note (Signed)
Sodium 127 presentation Hypovolemic in etiology IV fluid hydration Check urine and serum studies Monitor with IV fluids as well as blood transfusion Serial sodiums overnight Monitor

## 2023-03-24 NOTE — Assessment & Plan Note (Signed)
Noted solitary kidney on imaging Discussed importance of nephrotoxic agents including NSAIDs Monitor

## 2023-03-24 NOTE — Assessment & Plan Note (Signed)
Hemoglobin 15-6.3 in the setting of upper GI bleeding x 1 week associated with high-dose NSAID use Pending PRBC transfusion Check anemia panel Trend hemoglobin Transfuse for hemoglobin less than 7 Monitor

## 2023-03-24 NOTE — Assessment & Plan Note (Signed)
S/p Robotic assisted Umbilical Hernia Repair with mesh and plication of diastasis recti with Dr. Aleen Campi on 12/19  Incisions appear stable  Monitor

## 2023-03-24 NOTE — H&P (Addendum)
History and Physical    Patient: Justin Short:[id number] DOB: 1975/09/06 DOA: 03/24/2023 DOS: the patient was seen and examined on 03/24/2023 PCP: Lorre Munroe, NP  Patient coming from: Home  Chief Complaint:  Chief Complaint  Patient presents with   Possible GI Bleed   HPI: Justin Short is a 47 y.o. male with medical history significant of hemorrhoids status post hemorrhoidectomy, solitary kidney, umbilical hernia status post repair on December 19 presenting with upper GI bleeding.  Patient noted had umbilical hernia repair on December 19.  Patient reports taking regular high-dose NSAIDs since this point for pain relief.  Has been taking 800 mg every 6 hours for several days.  Noticed 1 large black bowel movement roughly 5 to 6 days ago.  Has secondary palpitations and weakness afterwards.  Symptoms relatively stabilized over the course of the week.  Reports having another large black bloody bowel movement over the past 24 hours with persistence of tachycardia weakness.  Positive generalized malaise over the course of the week. Minimal tobacco use. Denies regular ETOH use.  Presented to the ER afebrile, heart rate 100s, BP stable.  Satting well on room air.  White count 12.6, hemoglobin 6.3, platelets 405, creatinine 0.95.  Sodium 127. Review of Systems: As mentioned in the history of present illness. All other systems reviewed and are negative. Past Medical History:  Diagnosis Date   Anal fissure    Anxiety    no meds   Arthritis    Hemorrhoids    always present   Kidney disease    pt reports only one functional kidney (dx in 6th grade)but when he went back to pcp said it looks as though pt has both functioning kidneys   Past Surgical History:  Procedure Laterality Date   EVALUATION UNDER ANESTHESIA WITH HEMORRHOIDECTOMY N/A 04/11/2021   Procedure: EXAM UNDER ANESTHESIA WITH HEMORRHOIDECTOMY;  Surgeon: Henrene Dodge, MD;  Location: [facility] ORS;  Service: General;   Laterality: N/A;   GANGLION CYST EXCISION Left    pinky finger   INSERTION OF MESH  03/15/2023   Procedure: INSERTION OF MESH;  Surgeon: Henrene Dodge, MD;  Location: [facility] ORS;  Service: General;;  Umbilical   sphincterotomy  2006   lateral internal sphincterotomy for anal fissure   XI ROBOTIC ASSISTED VENTRAL HERNIA N/A 03/15/2023   Procedure: XI ROBOTIC ASSISTED UMBILICAL REPAIR HERNIA with plication of diastasis recti;  Surgeon: Henrene Dodge, MD;  Location: [facility] ORS;  Service: General;  Laterality: N/A;  umbilical   Social History:  reports that he has been smoking cigarettes and cigars. He started smoking about 2 years ago. He has a 11.2 pack-year smoking history. He has been exposed to tobacco smoke. He has never used smokeless tobacco. He reports current alcohol use. He reports current drug use. Frequency: 10.00 times per week. Drug: Marijuana.  No Known Allergies  Family History  Problem Relation Age of Onset   Cancer Mother        skin   Diabetes Father    Heart disease Father        CHF   Cancer Father        renal   Stroke Father    Kidney disease Father        dialysis s/p kidney surgery ? cancer   Thyroid disease Paternal Aunt     Prior to Admission medications   Medication Sig Start Date End Date Taking? Authorizing Provider  acetaminophen (TYLENOL) 500 MG tablet Take 2 tablets (  1,000 mg total) by mouth every 6 (six) hours as needed for mild pain (pain score 1-3). 03/15/23  Yes Piscoya, Elita Quick, MD  ibuprofen (ADVIL) 600 MG tablet Take 1 tablet (600 mg total) by mouth every 8 (eight) hours as needed for moderate pain (pain score 4-6). 03/15/23  Yes Piscoya, Elita Quick, MD  oxyCODONE (OXY IR/ROXICODONE) 5 MG immediate release tablet Take 1 tablet (5 mg total) by mouth every 4 (four) hours as needed for severe pain (pain score 7-10). 03/15/23  Yes Piscoya, Jose, MD  psyllium (METAMUCIL) 58.6 % powder Take 1 packet by mouth daily.   Yes [provider]    Physical  Exam: Vitals:   03/24/23 1212 03/24/23 1300 03/24/23 1315 03/24/23 1330  BP: 115/76 132/89  125/86  Pulse: (!) 148 (!) 104 100 (!) 135  Resp: 17 (!) 0 17 (!) 22  Temp: 98.8 F (37.1 C)     TempSrc: Oral     SpO2: 100% 99% 100% 100%  Weight:      Height:       Physical Exam Constitutional:      Appearance: He is normal weight.  HENT:     Head: Normocephalic and atraumatic.     Nose: Nose normal.     Mouth/Throat:     Mouth: Mucous membranes are moist.  Eyes:     Pupils: Pupils are equal, round, and reactive to light.  Cardiovascular:     Rate and Rhythm: Normal rate and regular rhythm.  Pulmonary:     Effort: Pulmonary effort is normal.  Abdominal:     General: Bowel sounds are normal.  Musculoskeletal:        General: Normal range of motion.  Skin:    General: Skin is warm.     Coloration: Skin is pale.  Neurological:     General: No focal deficit present.  Psychiatric:        Mood and Affect: Mood normal.     Data Reviewed:  There are no new results to review at this time.  CT ANGIO GI BLEED CLINICAL DATA:  Recent umbilical hernia repair, possible GI bleed  EXAM: CTA ABDOMEN AND PELVIS WITHOUT AND WITH CONTRAST  TECHNIQUE: Multidetector CT imaging of the abdomen and pelvis was performed using the standard protocol during bolus administration of intravenous contrast. Multiplanar reconstructed images and MIPs were obtained and reviewed to evaluate the vascular anatomy.  RADIATION DOSE REDUCTION: This exam was performed according to the departmental dose-optimization program which includes automated exposure control, adjustment of the mA and/or kV according to patient size and/or use of iterative reconstruction technique.  CONTRAST:  80mL OMNIPAQUE IOHEXOL 350 MG/ML SOLN  COMPARISON:  None Available.  FINDINGS: VASCULAR  Aorta: Normal caliber aorta without aneurysm, dissection, vasculitis or significant stenosis.  Celiac: Patent without evidence  of aneurysm, dissection, vasculitis or significant stenosis.  SMA: Patent without evidence of aneurysm, dissection, vasculitis or significant stenosis.  Renals: The right renal artery is patent without evidence of aneurysm, dissection, vasculitis, fibromuscular dysplasia or significant stenosis. Dysplastic left renal artery.  IMA: Patent without evidence of aneurysm, dissection, vasculitis or significant stenosis.  Inflow: Patent without evidence of aneurysm, dissection, vasculitis or significant stenosis.  Proximal Outflow: Bilateral common femoral and visualized portions of the superficial and profunda femoral arteries are patent without evidence of aneurysm, dissection, vasculitis or significant stenosis.  Veins: No focal venous abnormality.  Review of the MIP images confirms the above findings.  NON-VASCULAR  Lower chest: The intracardiac blood pool  is hypodense relative to the adjacent myocardium consistent with anemia. Otherwise, no acute cardiopulmonary process.  Hepatobiliary: No focal liver abnormality is seen. No gallstones, gallbladder wall thickening, or biliary dilatation.  Pancreas: Unremarkable. No pancreatic ductal dilatation or surrounding inflammatory changes.  Spleen: Normal in size without focal abnormality.  Adrenals/Urinary Tract: Adrenal glands are unremarkable. The right kidney is normal without renal calculi, focal lesion, or hydronephrosis. 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 Bladder is unremarkable.  Stomach/Bowel: Stomach is within normal limits. Appendix appears normal. No evidence of bowel wall thickening, distention, or inflammatory changes. No evidence of contrast extravasation to suggest acute GI bleeding.  Lymphatic: No suspicious lymphadenopathy.  Reproductive: Prostate is unremarkable.  Other: No abdominal wall hernia or abnormality. No abdominopelvic ascites. Postsurgical changes of umbilical hernia repair  without evidence of complication.  Musculoskeletal: No acute or significant osseous findings.  IMPRESSION: 1. The intracardiac blood pool is hypodense relative to the adjacent myocardium consistent with anemia. 2. No evidence of active GI bleeding at this time. 3. Surgical changes of umbilical hernia repair without evidence of complication. 4. 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  Electronically Signed   By: Malachy Moan M.D.   On: 03/24/2023 13:42  Lab Results  Component Value Date   WBC 12.6 (H) 03/24/2023   HGB 6.3 (L) 03/24/2023   HCT 18.2 (L) 03/24/2023   MCV 90.5 03/24/2023   PLT 405 (H) 03/24/2023   Last metabolic panel Lab Results  Component Value Date   GLUCOSE 143 (H) 03/24/2023   NA 127 (L) 03/24/2023   K 3.7 03/24/2023   CL 94 (L) 03/24/2023   CO2 23 03/24/2023   BUN 25 (H) 03/24/2023   CREATININE 0.95 03/24/2023   GFRNONAA >60 03/24/2023   CALCIUM 8.2 (L) 03/24/2023   PROT 5.5 (L) 03/24/2023   ALBUMIN 2.8 (L) 03/24/2023   BILITOT 0.5 03/24/2023   ALKPHOS 55 03/24/2023   AST 32 03/24/2023   ALT 54 (H) 03/24/2023   ANIONGAP 10 03/24/2023    Assessment and Plan: * UGIB (upper gastrointestinal bleed) Recurrent black stools x 1 week with worsening weakness and fatigue as well as tachycardia in the setting of high-dose NSAID use status post surgical repair of umbilical hernia Hemoglobin 15-6.3 Pending PRBC transfusion HOLD NSAIDs IV PPI Dr. Mia Creek with GI at the bedside with plan for endoscopy in the morning Trend hemoglobin monitor  Acute blood loss anemia Hemoglobin 15-6.3 in the setting of upper GI bleeding x 1 week associated with high-dose NSAID use Pending PRBC transfusion Check anemia panel Trend hemoglobin Transfuse for hemoglobin less than 7 Monitor  Solitary kidney, congenital Noted solitary kidney on imaging Discussed importance of nephrotoxic agents including  NSAIDs Monitor  Hyponatremia Sodium 127 presentation Hypovolemic in etiology IV fluid hydration Check urine and serum studies Monitor with IV fluids as well as blood transfusion Serial sodiums overnight Monitor  Umbilical hernia without obstruction and without gangrene S/p Robotic assisted Umbilical Hernia Repair with mesh and plication of diastasis recti with Dr. Aleen Campi on 12/19  Incisions appear stable  Monitor     Greater than 50% was spent in counseling and coordination of care with patient Total encounter time 80 minutes or more     Advance Care Planning:   Code Status: Full Code   Consults: GI   Family Communication: Family at the bedside   Severity of Illness: The appropriate patient status for this patient is INPATIENT. Inpatient status is judged to be reasonable and  necessary in order to provide the required intensity of service to ensure the patient's safety. The patient's presenting symptoms, physical exam findings, and initial radiographic and laboratory data in the context of their chronic comorbidities is felt to place them at high risk for further clinical deterioration. Furthermore, it is not anticipated that the patient will be medically stable for discharge from the hospital within 2 midnights of admission.   * I certify that at the point of admission it is my clinical judgment that the patient will require inpatient hospital care spanning beyond 2 midnights from the point of admission due to high intensity of service, high risk for further deterioration and high frequency of surveillance required.*  Author: Floydene Flock, MD 03/24/2023 2:35 PM  For on call review [email or web address].

## 2023-03-24 NOTE — Discharge Instructions (Addendum)
As we discussed, there is concern for possible hemorrhage given your pale complexion and fast heart rate.  This is further intensified by the fact that you are passing dark, tarry stools.  Please go to the emergency department at [facility] for further evaluation.

## 2023-03-24 NOTE — ED Provider Notes (Addendum)
[facility] Provider Note    Event Date/Time   First MD Initiated Contact with Patient 03/24/23 1227     (approximate)   History   Possible GI Bleed   HPI  Justin Short is a 47 y.o. male past medical history significant for umbilical hernia, anal fissure, who presents to the emergency department with tachycardia and heart palpitations.  States that he had a umbilical hernia repaired on 03/15/2023 robotically with mesh in place.  Was doing well afterwards and having normal bowel movements.  States that over the past couple of days he has felt significantly worse.  Feels like his heart is racing anytime he goes to get up.  Multiple episodes of melanotic stool and had large amount of melanotic stool today.  Decreased p.o. intake.  Endorses some nausea but no episodes of vomiting.  Denies any significant alcohol use.  Taking ibuprofen following surgery.  No history of stomach ulcer.  Does endorse smoking marijuana.  Not on anticoagulation.     Physical Exam   Triage Vital Signs: ED Triage Vitals  Encounter Vitals Group     BP 03/24/23 1212 115/76     Systolic BP Percentile --      Diastolic BP Percentile --      Pulse Rate 03/24/23 1212 (!) 148     Resp 03/24/23 1212 17     Temp 03/24/23 1212 98.8 F (37.1 C)     Temp Source 03/24/23 1212 Oral     SpO2 03/24/23 1212 100 %     Weight 03/24/23 1208 130 lb (59 kg)     Height 03/24/23 1208 5\' 4"  (1.626 m)     Head Circumference --      Peak Flow --      Pain Score 03/24/23 1207 0     Pain Loc --      Pain Education --      Exclude from Growth Chart --     Most recent vital signs: Vitals:   03/24/23 1300 03/24/23 1315  BP: 132/89   Pulse: (!) 104 100  Resp: (!) 0 17  Temp:    SpO2: 99% 100%    Physical Exam Constitutional:      Appearance: He is well-developed.     Comments: Pale appearing  HENT:     Head: Atraumatic.  Eyes:     Comments: Pale conjunctiva  Cardiovascular:     Rate  and Rhythm: Regular rhythm. Tachycardia present.  Pulmonary:     Effort: No respiratory distress.  Abdominal:     Tenderness: There is abdominal tenderness.     Comments: Abdominal binder in place.  Diffuse abdominal tenderness to palpation with no rebound or guarding.  Rectal exam with significant pain from recent hemorrhoids, no obvious melena but no stool in the rectal vault.  Musculoskeletal:     Cervical back: Normal range of motion.  Skin:    General: Skin is warm.     Capillary Refill: Capillary refill takes 2 to 3 seconds.  Neurological:     Mental Status: He is alert. Mental status is at baseline.     IMPRESSION / MDM / ASSESSMENT AND PLAN / ED COURSE  I reviewed the triage vital signs and the nursing notes.  Differential diagnosis including upper GI bleed, peptic ulcer disease, lower GI bleed  On chart review had a recent umbilical hernia repaired on 03/15/2023 with Dr. Aleen Campi  EKG  I, Corena Herter, the attending physician, personally viewed  and interpreted this ECG.   Rate: Normal  Rhythm: Normal sinus  Axis: Normal  Intervals: Normal  ST&T Change: None  Sinus tachycardia while on cardiac telemetry.  RADIOLOGY I independently reviewed imaging, my interpretation of imaging: CT abdomen and pelvis -no finding of acute GI bleed  LABS (all labs ordered are listed, but only abnormal results are displayed) Labs interpreted as -    Labs Reviewed  COMPREHENSIVE METABOLIC PANEL - Abnormal; Notable for the following components:      Result Value   Sodium 127 (*)    Chloride 94 (*)    Glucose, Bld 143 (*)    BUN 25 (*)    Calcium 8.2 (*)    Total Protein 5.5 (*)    Albumin 2.8 (*)    ALT 54 (*)    All other components within normal limits  CBC - Abnormal; Notable for the following components:   WBC 12.6 (*)    RBC 2.01 (*)    Hemoglobin 6.3 (*)    HCT 18.2 (*)    Platelets 405 (*)    All other components within normal limits  POC OCCULT BLOOD, ED  TYPE  AND SCREEN  PREPARE RBC (CROSSMATCH)  ABO/RH     MDM  On arrival patient with significant tachycardia, normotensive.  Hemoglobin resulted at 6.3 from baseline of 15.  Mildly elevated BUN.  Patient was typed and crossed for 2 units and transfused 1 unit PRBC.  Given IV Protonix given concern for upper GI bleed.  No known history of varices.  Discussed the patient's case with GI Dr. Mia Creek who recommended transfusion and CT scan.  Likely plan for scope tomorrow.  Consulted hospitalist for admission for anemia and acute GI bleed     PROCEDURES:  Critical Care performed: yes  .Critical Care  Performed by: Corena Herter, MD Authorized by: Corena Herter, MD   Critical care provider statement:    Critical care time (minutes):  45   Critical care time was exclusive of:  Separately billable procedures and treating other patients   Critical care was necessary to treat or prevent imminent or life-threatening deterioration of the following conditions:  Circulatory failure   Critical care was time spent personally by me on the following activities:  Development of treatment plan with patient or surrogate, discussions with consultants, evaluation of patient's response to treatment, examination of patient, ordering and review of laboratory studies, ordering and review of radiographic studies, ordering and performing treatments and interventions, pulse oximetry, re-evaluation of patient's condition and review of old charts   Care discussed with: admitting provider     Patient's presentation is most consistent with acute presentation with potential threat to life or bodily function.   MEDICATIONS ORDERED IN ED: Medications  0.9 %  sodium chloride infusion (has no administration in time range)  pantoprazole (PROTONIX) injection 80 mg (80 mg Intravenous Given 03/24/23 1326)  sodium chloride 0.9 % bolus 1,000 mL (1,000 mLs Intravenous New Bag/Given 03/24/23 1326)  iohexol (OMNIPAQUE) 350  MG/ML injection 80 mL (80 mLs Intravenous Contrast Given 03/24/23 1310)    FINAL CLINICAL IMPRESSION(S) / ED DIAGNOSES   Final diagnoses:  Gastrointestinal hemorrhage, unspecified gastrointestinal hemorrhage type  Anemia, unspecified type     Rx / DC Orders   ED Discharge Orders     None        Note:  This document was prepared using Dragon voice recognition software and may include unintentional dictation errors.   Corena Herter, MD 03/24/23  1252    Corena Herter, MD 03/24/23 1352

## 2023-03-25 ENCOUNTER — Inpatient Hospital Stay: Payer: 59 | Admitting: Anesthesiology

## 2023-03-25 ENCOUNTER — Encounter
Admission: EM | Disposition: A | Discharge status: Home / Self Care | Payer: Self-pay | Source: Home / Self Care | Attending: Internal Medicine

## 2023-03-25 ENCOUNTER — Encounter: Payer: Self-pay | Admitting: Family Medicine

## 2023-03-25 DIAGNOSIS — K922 Gastrointestinal hemorrhage, unspecified: Secondary | ICD-10-CM | POA: Diagnosis not present

## 2023-03-25 HISTORY — PX: HOT HEMOSTASIS: SHX5433

## 2023-03-25 HISTORY — PX: ESOPHAGOGASTRODUODENOSCOPY (EGD) WITH PROPOFOL: SHX5813

## 2023-03-25 LAB — HEMOGLOBIN AND HEMATOCRIT, BLOOD
HCT: 20.4 % — ABNORMAL LOW (ref 39.0–52.0)
HCT: 20.8 % — ABNORMAL LOW (ref 39.0–52.0)
HCT: 23 % — ABNORMAL LOW (ref 39.0–52.0)
HCT: 23.8 % — ABNORMAL LOW (ref 39.0–52.0)
Hemoglobin: 7.1 g/dL — ABNORMAL LOW (ref 13.0–17.0)
Hemoglobin: 7.4 g/dL — ABNORMAL LOW (ref 13.0–17.0)
Hemoglobin: 8 g/dL — ABNORMAL LOW (ref 13.0–17.0)
Hemoglobin: 8.3 g/dL — ABNORMAL LOW (ref 13.0–17.0)

## 2023-03-25 LAB — COMPREHENSIVE METABOLIC PANEL
ALT: 33 U/L (ref 0–44)
AST: 23 U/L (ref 15–41)
Albumin: 2.1 g/dL — ABNORMAL LOW (ref 3.5–5.0)
Alkaline Phosphatase: 39 U/L (ref 38–126)
Anion gap: 5 (ref 5–15)
BUN: 16 mg/dL (ref 6–20)
CO2: 24 mmol/L (ref 22–32)
Calcium: 7.4 mg/dL — ABNORMAL LOW (ref 8.9–10.3)
Chloride: 102 mmol/L (ref 98–111)
Creatinine, Ser: 0.85 mg/dL (ref 0.61–1.24)
GFR, Estimated: >60 mL/min (ref >60)
Glucose, Bld: 96 mg/dL (ref 70–99)
Potassium: 3.6 mmol/L (ref 3.5–5.1)
Sodium: 131 mmol/L — ABNORMAL LOW (ref 135–145)
Total Bilirubin: 0.6 mg/dL (ref <1.2)
Total Protein: 4.1 g/dL — ABNORMAL LOW (ref 6.5–8.1)

## 2023-03-25 LAB — PREPARE RBC (CROSSMATCH)

## 2023-03-25 SURGERY — ESOPHAGOGASTRODUODENOSCOPY (EGD) WITH PROPOFOL
Anesthesia: General

## 2023-03-25 MED ORDER — LIDOCAINE HCL (CARDIAC) PF 100 MG/5ML IV SOSY
PREFILLED_SYRINGE | INTRAVENOUS | Status: DC | PRN
  Administered 2023-03-25: 60 mg via INTRAVENOUS

## 2023-03-25 MED ORDER — PROPOFOL 10 MG/ML IV BOLUS
INTRAVENOUS | Status: AC
  Filled 2023-03-25: qty 40

## 2023-03-25 MED ORDER — EPINEPHRINE 1 MG/10ML IJ SOSY
PREFILLED_SYRINGE | INTRAMUSCULAR | Status: AC
  Filled 2023-03-25: qty 10

## 2023-03-25 MED ORDER — PROPOFOL 10 MG/ML IV BOLUS
INTRAVENOUS | Status: DC | PRN
  Administered 2023-03-25 (×4): 20 mg via INTRAVENOUS
  Administered 2023-03-25 (×2): 10 mg via INTRAVENOUS
  Administered 2023-03-25: 70 mg via INTRAVENOUS
  Administered 2023-03-25: 20 mg via INTRAVENOUS
  Administered 2023-03-25: 10 mg via INTRAVENOUS

## 2023-03-25 MED ORDER — LIDOCAINE HCL (PF) 2 % IJ SOLN
INTRAMUSCULAR | Status: AC
  Filled 2023-03-25: qty 5

## 2023-03-25 MED ORDER — SODIUM CHLORIDE (PF) 0.9 % IJ SOLN
PREFILLED_SYRINGE | INTRAMUSCULAR | Status: DC | PRN
  Administered 2023-03-25: 2 mL

## 2023-03-25 MED ORDER — OXYCODONE HCL 5 MG PO TABS
5.0000 mg | ORAL_TABLET | ORAL | Status: DC | PRN
  Administered 2023-03-25 – 2023-03-26 (×2): 5 mg via ORAL
  Filled 2023-03-25 (×2): qty 1

## 2023-03-25 MED ORDER — SODIUM CHLORIDE 0.9 % IV SOLN: Freq: Once | INTRAVENOUS | Status: AC

## 2023-03-25 NOTE — ED Notes (Signed)
Pt sitting up in bed. No distress noted. Pt request ice. Ice and popsicles provided. Pt encouraged to alert staff to any needs. CB within reach.

## 2023-03-25 NOTE — ED Notes (Signed)
Pt reports that they had a large tarry bowel movement earlier today. They said that they let the EDNT know, but it was not reported to this RN until RN spoke with pt.

## 2023-03-25 NOTE — Care Plan (Signed)
EGD showed two cratered duodenal ulcers with one having a possible visible vessel that was treated with epi and bipolar cautery.  - needs 48 more hours of IV PPI - clear liquids today and can advance tomorrow if no issues overnight - no more nsaids - monitor for recurrent GI bleeding - will continue to follow, please call with any questions or concerns.  Merlyn Lot MD, MPH [facility]

## 2023-03-25 NOTE — ED Notes (Signed)
Notified NP Jon Billings of pt HGB trending down, now at 7.1.

## 2023-03-25 NOTE — Anesthesia Preprocedure Evaluation (Signed)
Anesthesia Evaluation  Patient identified by MRN, date of birth, ID band Patient awake    Reviewed: Allergy & Precautions, H&P , NPO status , Patient's Chart, lab work & pertinent test results  Airway Mallampati: II  TM Distance: >3 FB Neck ROM: full    Dental no notable dental hx.    Pulmonary Current Smoker and Patient abstained from smoking.   Pulmonary exam normal        Cardiovascular negative cardio ROS Normal cardiovascular exam     Neuro/Psych  PSYCHIATRIC DISORDERS Anxiety     negative neurological ROS     GI/Hepatic negative GI ROS, Neg liver ROS,,,  Endo/Other  negative endocrine ROS    Renal/GU Solitary kidney, congenital Noted solitary kidney on imaging   negative genitourinary   Musculoskeletal   Abdominal   Peds  Hematology  (+) Blood dyscrasia, anemia   Anesthesia Other Findings ROBOTIC ASSISTED VENTRAL HERNIA 03/15/2023 GIB s/p 1 unit PRBC now HDS with VSS   Past Medical History: No date: Anal fissure No date: Anxiety     Comment:  no meds No date: Arthritis No date: Hemorrhoids     Comment:  always present No date: Kidney disease     Comment:  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)but when he went back to pcp said it looks as               though pt has both functioning kidneys  Past Surgical History: 04/11/2021: EVALUATION UNDER ANESTHESIA WITH HEMORRHOIDECTOMY; N/A     Comment:  Procedure: EXAM UNDER ANESTHESIA WITH HEMORRHOIDECTOMY;               Surgeon: Henrene Dodge, MD;  Location: [facility] ORS;                Service: General;  Laterality: N/A; No date: GANGLION CYST EXCISION; Left     Comment:  pinky finger 03/15/2023: INSERTION OF MESH     Comment:  Procedure: INSERTION OF MESH;  Surgeon: Henrene Dodge,               MD;  Location: [facility] ORS;  Service: General;;  Umbilical 2006: sphincterotomy     Comment:  lateral internal sphincterotomy for anal  fissure 03/15/2023: XI ROBOTIC ASSISTED VENTRAL HERNIA; N/A     Comment:  Procedure: XI ROBOTIC ASSISTED UMBILICAL REPAIR HERNIA               with plication of diastasis recti;  Surgeon: Henrene Dodge, MD;  Location: [facility] ORS;  Service: General;                Laterality: N/A;  umbilical  BMI    Body Mass Index: 22.31 kg/m      Reproductive/Obstetrics negative OB ROS                              Anesthesia Physical Anesthesia Plan  ASA: 2  Anesthesia Plan: General   Post-op Pain Management:    Induction:   PONV Risk Score and Plan: Propofol infusion and TIVA  Airway Management Planned:   Additional Equipment:   Intra-op Plan:   Post-operative Plan:   Informed Consent:      Dental Advisory Given  Plan Discussed with: CRNA and Surgeon  Anesthesia Plan Comments:          Anesthesia Quick Evaluation

## 2023-03-25 NOTE — Progress Notes (Signed)
PROGRESS NOTE    Justin Short  MWU:[id number] DOB: [birth year] DOA: 03/24/2023 PCP: Lorre Munroe, NP    Brief Narrative:  Justin Short is a 47 y.o. male with medical history significant of hemorrhoids status post hemorrhoidectomy, solitary kidney, umbilical hernia status post repair on December 19 presenting with upper GI bleeding.  Patient noted had umbilical hernia repair on December 19.  Patient reports taking regular high-dose NSAIDs since this point for pain relief.  Has been taking 800 mg every 6 hours for several days.  Noticed 1 large black bowel movement roughly 5 to 6 days ago.  S/p EGD.  Home tomorrow PM if stable after 48 hours of IV PPI   Assessment and Plan:  UGIB (upper gastrointestinal bleed) Recurrent black stools x 1 week with worsening weakness and fatigue as well as tachycardia in the setting of high-dose NSAID use status post surgical repair of umbilical hernia Hemoglobin 15--->6.3 S/p 2 units prbc HOLD NSAIDs IV PPI Dr. Mia Creek: EGD:  Non-bleeding duodenal ulcer with a nonbleeding visible vessel (Forrest Class IIa). Injected. Treated with bipolar cautery: Can advance diet tomorrow if doing well. Will need to be in the hospital for 48 more hours to receive IV   PPI due to chance of rebleeding. If doing well after  48 hours, can be discharged with PO PPI BID for 8 weeks.  Acute blood loss anemia Hemoglobin 15-6.3 in the setting of upper GI bleeding x 1 week associated with high-dose NSAID use B12 low-- replete -appears to be getting 2 units of PRBC-- check H/H after   Solitary kidney, congenital Noted solitary kidney on imaging Discussed importance of nephrotoxic agents including NSAIDs Monitor  Hyponatremia Sodium 127 presentation -trending up with IVF  Umbilical hernia without obstruction and without gangrene S/p Robotic assisted Umbilical Hernia Repair with mesh and plication of diastasis recti with Dr. Aleen Campi on 12/19  Incisions appear  stable  Monitor       DVT prophylaxis: Place TED hose Start: 03/24/23 1427    Code Status: Full Code   Disposition Plan:  Level of care: Telemetry Medical Status is: Inpatient     Consultants:  GI   Subjective: Tired, no pain currently  Objective: Vitals:   03/25/23 0741 03/25/23 0751 03/25/23 0817 03/25/23 0829  BP: 113/67 130/68 (!) 150/75 (!) 146/111  Pulse: 80 67 94 79  Resp: 19 17 (!) 26 19  Temp: 97.6 F (36.4 C)     TempSrc: Temporal     SpO2: 100% 100% 99% 100%  Weight:      Height:       No intake or output data in the 24 hours ending 03/25/23 0830 Filed Weights   03/24/23 1208  Weight: 59 kg    Examination:   General: Appearance:    Well developed, well nourished male in no acute distress     Lungs:     Clear to auscultation bilaterally, respirations unlabored  Heart:    Normal heart rate.     MS:   All extremities are intact.    Neurologic:   Awake, alert       Data Reviewed: I have personally reviewed following labs and imaging studies  CBC: Recent Labs  Lab 03/24/23 1212 03/24/23 2136 03/25/23 0022 03/25/23 0443  WBC 12.6*  --   --   --   HGB 6.3* 8.1* 7.4* 7.1*  HCT 18.2* 22.6* 20.8* 20.4*  MCV 90.5  --   --   --  PLT 405*  --   --   --    Basic Metabolic Panel: Recent Labs  Lab 03/24/23 1212 03/24/23 1528 03/24/23 2136 03/25/23 0443  NA 127* 129* 130* 131*  K 3.7  --   --  3.6  CL 94*  --   --  102  CO2 23  --   --  24  GLUCOSE 143*  --   --  96  BUN 25*  --   --  16  CREATININE 0.95  --   --  0.85  CALCIUM 8.2*  --   --  7.4*   GFR: Estimated Creatinine Clearance: 90.6 mL/min (by C-G formula based on SCr of 0.85 mg/dL). Liver Function Tests: Recent Labs  Lab 03/24/23 1212 03/25/23 0443  AST 32 23  ALT 54* 33  ALKPHOS 55 39  BILITOT 0.5 0.6  PROT 5.5* 4.1*  ALBUMIN 2.8* 2.1*   No results for input(s): "LIPASE", "AMYLASE" in the last 168 hours. No results for input(s): "AMMONIA" in the last 168  hours. Coagulation Profile: No results for input(s): "INR", "PROTIME" in the last 168 hours. Cardiac Enzymes: No results for input(s): "CKTOTAL", "CKMB", "CKMBINDEX", "TROPONINI" in the last 168 hours. BNP (last 3 results) No results for input(s): "PROBNP" in the last 8760 hours. HbA1C: No results for input(s): "HGBA1C" in the last 72 hours. CBG: No results for input(s): "GLUCAP" in the last 168 hours. Lipid Profile: No results for input(s): "CHOL", "HDL", "LDLCALC", "TRIG", "CHOLHDL", "LDLDIRECT" in the last 72 hours. Thyroid Function Tests: No results for input(s): "TSH", "T4TOTAL", "FREET4", "T3FREE", "THYROIDAB" in the last 72 hours. Anemia Panel: Recent Labs    03/24/23 1528  VITAMINB12 169*  FOLATE 14.3  FERRITIN 125  TIBC 272  IRON 103  RETICCTPCT 3.3*   Sepsis Labs: No results for input(s): "PROCALCITON", "LATICACIDVEN" in the last 168 hours.  No results found for this or any previous visit (from the past 240 hours).       Radiology Studies: CT ANGIO GI BLEED Result Date: 03/24/2023 CLINICAL DATA:  Recent umbilical hernia repair, possible GI bleed EXAM: CTA ABDOMEN AND PELVIS WITHOUT AND WITH CONTRAST TECHNIQUE: Multidetector CT imaging of the abdomen and pelvis was performed using the standard protocol during bolus administration of intravenous contrast. Multiplanar reconstructed images and MIPs were obtained and reviewed to evaluate the vascular anatomy. RADIATION DOSE REDUCTION: This exam was performed according to the departmental dose-optimization program which includes automated exposure control, adjustment of the mA and/or kV according to patient size and/or use of iterative reconstruction technique. CONTRAST:  80mL OMNIPAQUE IOHEXOL 350 MG/ML SOLN COMPARISON:  None Available. FINDINGS: VASCULAR Aorta: Normal caliber aorta without aneurysm, dissection, vasculitis or significant stenosis. Celiac: Patent without evidence of aneurysm, dissection, vasculitis or  significant stenosis. SMA: Patent without evidence of aneurysm, dissection, vasculitis or significant stenosis. Renals: The right renal artery is patent without evidence of aneurysm, dissection, vasculitis, fibromuscular dysplasia or significant stenosis. Dysplastic left renal artery. IMA: Patent without evidence of aneurysm, dissection, vasculitis or significant stenosis. Inflow: Patent without evidence of aneurysm, dissection, vasculitis or significant stenosis. Proximal Outflow: Bilateral common femoral and visualized portions of the superficial and profunda femoral arteries are patent without evidence of aneurysm, dissection, vasculitis or significant stenosis. Veins: No focal venous abnormality. Review of the MIP images confirms the above findings. NON-VASCULAR Lower chest: The intracardiac blood pool is hypodense relative to the adjacent myocardium consistent with anemia. Otherwise, no acute cardiopulmonary process. Hepatobiliary: No focal liver abnormality is seen. No  gallstones, gallbladder wall thickening, or biliary dilatation. Pancreas: Unremarkable. No pancreatic ductal dilatation or surrounding inflammatory changes. Spleen: Normal in size without focal abnormality. Adrenals/Urinary Tract: Adrenal glands are unremarkable. The right kidney is normal without renal calculi, focal lesion, or hydronephrosis. 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 Bladder is unremarkable. Stomach/Bowel: Stomach is within normal limits. Appendix appears normal. No evidence of bowel wall thickening, distention, or inflammatory changes. No evidence of contrast extravasation to suggest acute GI bleeding. Lymphatic: No suspicious lymphadenopathy. Reproductive: Prostate is unremarkable. Other: No abdominal wall hernia or abnormality. No abdominopelvic ascites. Postsurgical changes of umbilical hernia repair without evidence of complication. Musculoskeletal: No acute or significant osseous findings. IMPRESSION: 1. The  intracardiac blood pool is hypodense relative to the adjacent myocardium consistent with anemia. 2. No evidence of active GI bleeding at this time. 3. Surgical changes of umbilical hernia repair without evidence of complication. 4. 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 Electronically Signed   By: Malachy Moan M.D.   On: 03/24/2023 13:42        Scheduled Meds:  [MAR Hold] pantoprazole (PROTONIX) IV  40 mg Intravenous Q12H   Continuous Infusions:  [MAR Hold] sodium chloride Stopped (03/24/23 1456)   sodium chloride 100 mL/hr at 03/25/23 0800   [MAR Hold] sodium chloride       LOS: 1 day    Time spent: 45 minutes spent on chart review, discussion with nursing staff, consultants, updating family and interview/physical exam; more than 50% of that time was spent in counseling and/or coordination of care.    Joseph Art, DO [facility] Available via Epic secure chat 7am-7pm After these hours, please refer to coverage provider listed on [email or web address] 03/25/2023, 8:30 AM

## 2023-03-25 NOTE — ED Notes (Signed)
Pt placed in hospital bed to increase comfort. Pt ambulatory to bedside toilet without incident.

## 2023-03-25 NOTE — Transfer of Care (Signed)
Immediate Anesthesia Transfer of Care Note  Patient: Justin Short  Procedure(s) Performed: ESOPHAGOGASTRODUODENOSCOPY (EGD) WITH PROPOFOL  Patient Location: PACU  Anesthesia Type:General  Level of Consciousness: awake, alert , and oriented  Airway & Oxygen Therapy: Patient Spontanous Breathing  Post-op Assessment: Report given to RN and Post -op Vital signs reviewed and stable  Post vital signs: Reviewed and stable  Last Vitals:  Vitals Value Taken Time  BP 150/75 03/25/23 0816  Temp    Pulse 94 03/25/23 0816  Resp 26 03/25/23 0816  SpO2 99 % 03/25/23 0816  Vitals shown include unfiled device data.  Last Pain:  Vitals:   03/25/23 0741  TempSrc: Temporal  PainSc: 3          Complications: No notable events documented.

## 2023-03-25 NOTE — Anesthesia Postprocedure Evaluation (Signed)
Anesthesia Post Note  Patient: UZAIR MANER  Procedure(s) Performed: ESOPHAGOGASTRODUODENOSCOPY (EGD) WITH PROPOFOL HOT HEMOSTASIS (ARGON PLASMA COAGULATION/BICAP)  Patient location during evaluation: PACU Anesthesia Type: General Level of consciousness: awake and alert Pain management: pain level controlled Vital Signs Assessment: post-procedure vital signs reviewed and stable Respiratory status: spontaneous breathing, nonlabored ventilation and respiratory function stable Cardiovascular status: blood pressure returned to baseline and stable Postop Assessment: no apparent nausea or vomiting Anesthetic complications: no   No notable events documented.   Last Vitals:  Vitals:   03/25/23 0829 03/25/23 0836  BP: (!) 146/78 (!) 142/75  Pulse: 79 75  Resp: 19 18  Temp:    SpO2: 100% 99%    Last Pain:  Vitals:   03/25/23 0829  TempSrc:   PainSc: 4                  Foye Deer

## 2023-03-25 NOTE — Op Note (Signed)
[facility] Gastroenterology Patient Name: Justin Short Procedure Date: 03/25/2023 7:50 AM MRN: [id number] Account #: [id number] Date of Birth: 08/01/1975 Admit Type: Outpatient Age: 47 Room: [facility] ENDO ROOM 4 Gender: Male Note Status: Finalized Instrument Name: Upper Endoscope [id number] Procedure:             Upper GI endoscopy Indications:           Melena Providers:             Eather Colas MD, MD Medicines:             Monitored Anesthesia Care Complications:         No immediate complications. Estimated blood loss:                         Minimal. Procedure:             Pre-Anesthesia Assessment:                        - Prior to the procedure, a History and Physical was                         performed, and patient medications and allergies were                         reviewed. The patient is competent. The risks and                         benefits of the procedure and the sedation options and                         risks were discussed with the patient. All questions                         were answered and informed consent was obtained.                         Patient identification and proposed procedure were                         verified by the physician, the nurse, the                         anesthesiologist, the anesthetist and the technician                         in the endoscopy suite. Mental Status Examination:                         alert and oriented. Airway Examination: normal                         oropharyngeal airway and neck mobility. Respiratory                         Examination: clear to auscultation. CV Examination:                         normal. Prophylactic Antibiotics: The patient does not  require prophylactic antibiotics. Prior                         Anticoagulants: The patient has taken no anticoagulant                         or antiplatelet agents. ASA Grade Assessment: II - A                          patient with mild systemic disease. After reviewing                         the risks and benefits, the patient was deemed in                         satisfactory condition to undergo the procedure. The                         anesthesia plan was to use monitored anesthesia care                         (MAC). Immediately prior to administration of                         medications, the patient was re-assessed for adequacy                         to receive sedatives. The heart rate, respiratory                         rate, oxygen saturations, blood pressure, adequacy of                         pulmonary ventilation, and response to care were                         monitored throughout the procedure. The physical                         status of the patient was re-assessed after the                         procedure.                        After obtaining informed consent, the endoscope was                         passed under direct vision. Throughout the procedure,                         the patient's blood pressure, pulse, and oxygen                         saturations were monitored continuously. The Endoscope                         was introduced through the mouth, and advanced to the  second part of duodenum. The upper GI endoscopy was                         accomplished without difficulty. The patient tolerated                         the procedure well. Findings:      The examined esophagus was normal.      The entire examined stomach was normal.      One non-bleeding cratered duodenal ulcer with a flat pigmented spot       (Forrest Class IIc) was found in the first portion of the duodenum. The       lesion was 5 mm in largest dimension.      One non-bleeding cratered duodenal ulcer with a nonbleeding visible       vessel (Forrest Class IIa) was found in the second portion of the       duodenum. The lesion was 5 mm in largest dimension.  Area was       successfully injected with 2 mL of a 0.1 mg/mL solution of epinephrine       for drug delivery. Coagulation for tissue destruction using bipolar       probe was successful. Estimated blood loss was minimal. Impression:            - Normal esophagus.                        - Normal stomach.                        - Non-bleeding duodenal ulcer with a flat pigmented                         spot (Forrest Class IIc).                        - Non-bleeding duodenal ulcer with a nonbleeding                         visible vessel (Forrest Class IIa). Injected. Treated                         with bipolar cautery.                        - No specimens collected. Recommendation:        - Return patient to hospital ward for ongoing care.                        - Clear liquid diet today.                        - Use a proton pump inhibitor IV BID for 2 days.                        - No ibuprofen, naproxen, or other non-steroidal                         anti-inflammatory drugs.                        -  Can advance diet tomorrow if doing well. Will need                         to be in the hospital for 48 more hours to receive IV                         PPI due to chance of rebleeding. If doing well after                         48 hours, can be discharged with PO PPI BID for 8                         weeks. Procedure Code(s):     --- Professional ---                        [id number], Esophagogastroduodenoscopy, flexible,                         transoral; with ablation of tumor(s), polyp(s), or                         other lesion(s) (includes pre- and post-dilation and                         guide wire passage, when performed) Diagnosis Code(s):     --- Professional ---                        K26.9, Duodenal ulcer, unspecified as acute or                         chronic, without hemorrhage or perforation                        K26.4, Chronic or unspecified duodenal ulcer with                          hemorrhage                        K92.1, Melena (includes Hematochezia) CPT copyright 2022 American Medical Association. All rights reserved. The codes documented in this report are preliminary and upon coder review may  be revised to meet current compliance requirements. Eather Colas MD, MD 03/25/2023 8:22:56 AM Number of Addenda: 0 Note Initiated On: 03/25/2023 7:50 AM Estimated Blood Loss:  Estimated blood loss was minimal.      [facility]

## 2023-03-25 NOTE — ED Notes (Signed)
Pt to Endo 

## 2023-03-26 ENCOUNTER — Encounter: Payer: Self-pay | Admitting: Gastroenterology

## 2023-03-26 DIAGNOSIS — K922 Gastrointestinal hemorrhage, unspecified: Secondary | ICD-10-CM | POA: Diagnosis not present

## 2023-03-26 LAB — BPAM RBC
Blood Product Expiration Date: 202412312359
Blood Product Expiration Date: 202501262359
Blood Product Expiration Date: 202501262359
ISSUE DATE / TIME: 202412290621
ISSUE DATE / TIME: 202412281507
ISSUE DATE / TIME: 202412281735
Unit Type and Rh: 9500
Unit Type and Rh: 5100
Unit Type and Rh: 5100

## 2023-03-26 LAB — CBC
HCT: 23 % — ABNORMAL LOW (ref 39.0–52.0)
Hemoglobin: 7.9 g/dL — ABNORMAL LOW (ref 13.0–17.0)
MCH: 31.2 pg (ref 26.0–34.0)
MCHC: 34.3 g/dL (ref 30.0–36.0)
MCV: 90.9 fL (ref 80.0–100.0)
Platelets: 331 10*3/uL (ref 150–400)
RBC: 2.53 MIL/uL — ABNORMAL LOW (ref 4.22–5.81)
RDW: 16.2 % — ABNORMAL HIGH (ref 11.5–15.5)
WBC: 9.7 10*3/uL (ref 4.0–10.5)
nRBC: 0.2 % (ref 0.0–0.2)

## 2023-03-26 LAB — TYPE AND SCREEN
ABO/RH(D): O POS
Antibody Screen: NEGATIVE
Unit division: 0
Unit division: 0
Unit division: 0

## 2023-03-26 LAB — CBC WITH DIFFERENTIAL/PLATELET
Abs Immature Granulocytes: 0.26 10*3/uL — ABNORMAL HIGH (ref 0.00–0.07)
Basophils Absolute: 0.1 10*3/uL (ref 0.0–0.1)
Basophils Relative: 0 %
Eosinophils Absolute: 0.3 10*3/uL (ref 0.0–0.5)
Eosinophils Relative: 2 %
HCT: 24.4 % — ABNORMAL LOW (ref 39.0–52.0)
Hemoglobin: 8.4 g/dL — ABNORMAL LOW (ref 13.0–17.0)
Immature Granulocytes: 2 %
Lymphocytes Relative: 25 %
Lymphs Abs: 3.1 10*3/uL (ref 0.7–4.0)
MCH: 30.7 pg (ref 26.0–34.0)
MCHC: 34.4 g/dL (ref 30.0–36.0)
MCV: 89.1 fL (ref 80.0–100.0)
Monocytes Absolute: 1 10*3/uL (ref 0.1–1.0)
Monocytes Relative: 8 %
Neutro Abs: 7.7 10*3/uL (ref 1.7–7.7)
Neutrophils Relative %: 63 %
Platelets: 422 10*3/uL — ABNORMAL HIGH (ref 150–400)
RBC: 2.74 MIL/uL — ABNORMAL LOW (ref 4.22–5.81)
RDW: 15.8 % — ABNORMAL HIGH (ref 11.5–15.5)
WBC: 12.3 10*3/uL — ABNORMAL HIGH (ref 4.0–10.5)
nRBC: 0 % (ref 0.0–0.2)

## 2023-03-26 MED ORDER — PSYLLIUM 95 % PO PACK
1.0000 | PACK | Freq: Once | ORAL | Status: DC
  Filled 2023-03-26: qty 1

## 2023-03-26 MED ORDER — TRAZODONE HCL 50 MG PO TABS
50.0000 mg | ORAL_TABLET | Freq: Every evening | ORAL | Status: DC | PRN
  Administered 2023-03-26: 50 mg via ORAL
  Filled 2023-03-26: qty 1

## 2023-03-26 NOTE — ED Notes (Signed)
Pt given incentive spirometer with teach back.

## 2023-03-26 NOTE — ED Notes (Signed)
Pt resting quietly with eyes closed. Respirations even and non labored. Cardiac monitoring remains in place. CB within reach.

## 2023-03-26 NOTE — Progress Notes (Signed)
GI Inpatient Follow-up Note  Subjective:  Patient seen and doing well. Some chest wall tenderness with deep inspiration. No bleeding. Hemoglobin stable.  Scheduled Inpatient Medications:   pantoprazole (PROTONIX) IV  40 mg Intravenous Q12H    Continuous Inpatient Infusions:    sodium chloride Stopped (03/24/23 1456)    PRN Inpatient Medications:  HYDROmorphone (DILAUDID) injection, LORazepam, ondansetron **OR** ondansetron (ZOFRAN) IV, oxyCODONE, traZODone  Review of Systems:  Review of Systems  Constitutional:  Negative for chills and fever.  Respiratory:  Negative for shortness of breath.   Cardiovascular:  Positive for chest pain. Negative for leg swelling.  Gastrointestinal:  Negative for blood in stool and melena.  Skin:  Negative for itching and rash.  Neurological:  Negative for focal weakness.  Psychiatric/Behavioral:  Negative for substance abuse.   All other systems reviewed and are negative.     Physical Examination: BP 134/67   Pulse 77   Temp 98.7 F (37.1 C) (Oral)   Resp (!) 27   Ht 5\' 4"  (1.626 m)   Wt 59 kg   SpO2 99%   BMI 22.31 kg/m  Gen: NAD, alert and oriented x 4 HEENT: PEERLA, EOMI, Neck: supple, no JVD or thyromegaly Chest: No respiratory distress Abd: soft, NT, ND Ext: no edema, well perfused with 2+ pulses, Skin: no rash or lesions noted Lymph: no LAD  Data: Lab Results  Component Value Date   WBC 9.7 03/26/2023   HGB 7.9 (L) 03/26/2023   HCT 23.0 (L) 03/26/2023   MCV 90.9 03/26/2023   PLT 331 03/26/2023   Recent Labs  Lab 03/25/23 1558 03/25/23 1838 03/26/23 0420  HGB 8.3* 8.0* 7.9*   Lab Results  Component Value Date   NA 131 (L) 03/25/2023   K 3.6 03/25/2023   CL 102 03/25/2023   CO2 24 03/25/2023   BUN 16 03/25/2023   CREATININE 0.85 03/25/2023   Lab Results  Component Value Date   ALT 33 03/25/2023   AST 23 03/25/2023   ALKPHOS 39 03/25/2023   BILITOT 0.6 03/25/2023   No results for input(s): "APTT",  "INR", "PTT" in the last 168 hours. Assessment/Plan: Mr. Ooten is a 47 y.o. gentleman with duodenal ulcers with one requiring epi and bipolar cautery. Ulcers likely from NSAIDS.  Recommendations:  - continue with IV PPI BID - will advance diet - consider IV iron - if no issues overnight, can likely be discharged tomorrow with outpatient f/u - consider chest x-ray if chest wall pain is persistent  Please call with any questions or concerns.  Merlyn Lot MD, MPH [facility] GI

## 2023-03-26 NOTE — ED Notes (Signed)
Pt requesting metamucil as his diet was advanced from liquid to regular. Notified covering NP.

## 2023-03-26 NOTE — ED Notes (Signed)
Attending messaged about Pt's increasing pain level when I got here he rated a 7/10 pain for left sided chest pain, states it hurts when he breathes. I gave him his PRN pain meds earlier and an incentive spirometer with teachback and then just now I gave his PRN oxycodone. His vitals are 129/73, 98.4 oral temp, 77 BPM, 99% RA, rates pain a 8/10 before I gave him his PRN oxycodone. Asked HCP if they wanted CXR or continue to wait until tmrw.

## 2023-03-26 NOTE — ED Notes (Signed)
IV dressing replaced.

## 2023-03-26 NOTE — ED Notes (Signed)
Pt resting quietly with eyes closed. Respirations even and non labored. CB remains within reach. Cardiac monitoring in placed.

## 2023-03-26 NOTE — ED Notes (Signed)
Pt awakens easily to verbal stimuli for blood draw. Denies any needs at this time. CB remains within reach. Encouraged to alert staff to any needs.

## 2023-03-26 NOTE — Progress Notes (Signed)
Progress Note   Patient: Justin Short WNU:[id number] DOB: [birth year] DOA: 03/24/2023     2 DOS: the patient was seen and examined on 03/26/2023   Brief hospital course: 47yo with h/o hemorrhoids s/p hemorrhoidectomy, solitary kidney, and umbilical hernia s/p repair on 12/19 who presented on 12/28 with UGI bleeding from NSAIDs.  He underwent EGD on 12/29 and was found to have 2 non-bleeding duodenal ulcers, one with a visible vessel that was injected and cauterized.  He is recommended to have IV PPI through 12/31 and then will need BID PPI for 8 weeks.  Assessment and Plan:  UGIB (upper gastrointestinal bleed) Recurrent black stools x 1 week with worsening weakness and fatigue as well as tachycardia in the setting of high-dose NSAID use s/p surgical repair of umbilical hernia Hemoglobin 15 -> 6.3 Transfused 3 units PRBC HOLD NSAIDs EGD with 2 ulcers, 1 with visible vessel s/p cautery and injection IV PPI BID x 48 hours -> PO PPI BID  x 8 weeks   Acute blood loss anemia Hemoglobin 15 -> 6.3 in the setting of upper GI bleeding x 1 week associated with high-dose NSAID use S/p PRBC transfusion Continue to monitor with CBC BID Transfuse for hemoglobin less than 7   Solitary kidney, congenital Noted solitary kidney on imaging Discussed importance of nephrotoxic agents including NSAIDs Normal renal function   Hyponatremia Sodium 127 -> 131 Hypovolemic in etiology Improving with hydration   Umbilical hernia without obstruction and without gangrene S/p Robotic assisted Umbilical Hernia Repair with mesh and plication of diastasis recti with Dr. Aleen Campi on 12/19  Incisions appear stable      Consultants: GI  Procedures: EGD 12/29  Antibiotics: None  30 Day Unplanned Readmission Risk Score    Flowsheet Row ED to Hosp-Admission (Current) from 03/24/2023 in [facility] Emergency Department at [facility]  30 Day Unplanned Readmission Risk Score (%) 8.51 Filed at  03/26/2023 0801       This score is the patient's risk of an unplanned readmission within 30 days of being discharged (0 -100%). The score is based on dignosis, age, lab data, medications, orders, and past utilization.   Low:  0-14.9   Medium: 15-21.9   High: 22-29.9   Extreme: 30 and above            Subjective: Feeling better.  No BMs since yesterday.  Ongoing mild to moderate epigastric pain but overall improved.  He did feel fatigued this AM when he started to sit up.  Physical Exam: Vitals:   03/26/23 0427 03/26/23 0500 03/26/23 0600 03/26/23 0601  BP:  (!) 105/53 108/70   Pulse:  80 66   Resp:  16 14   Temp: 98.7 F (37.1 C)     TempSrc: Oral     SpO2:  96% 97% 97%  Weight:      Height:         No intake or output data in the 24 hours ending 03/26/23 0842 Filed Weights   03/24/23 1208  Weight: 59 kg    Exam:  General:  Appears calm and comfortable and is in NAD Eyes:  EOMI, normal lids, iris ENT:  grossly normal hearing, lips & tongue, mmm Neck:  no LAD, masses or thyromegaly Cardiovascular:  RRR, no m/r/g. No LE edema.  Respiratory:   CTA bilaterally with no wheezes/rales/rhonchi.  Normal respiratory effort. Abdomen:  soft, epigastric TTP, ND Skin:  no rash or induration seen on limited exam Musculoskeletal:  grossly normal  tone BUE/BLE, good ROM, no bony abnormality Psychiatric:  anxious mood and affect, speech fluent and appropriate, AOx3 Neurologic:  CN 2-12 grossly intact, moves all extremities in coordinated fashion  Data Reviewed: I have reviewed the patient's lab results since admission.  Pertinent labs for today include:   WBC 9.7 Hgb 7.9, up from 6.3 on 12/28    Family Communication: None present  Disposition: Status is: Inpatient Remains inpatient appropriate because: ongoing IV PPI  Planned Discharge Destination: Home    Time spent: 50 minutes  Author: Jonah Blue, MD 03/26/2023 8:42 AM  For on call review  [email or web address].

## 2023-03-27 ENCOUNTER — Inpatient Hospital Stay: Payer: 59

## 2023-03-27 DIAGNOSIS — K922 Gastrointestinal hemorrhage, unspecified: Secondary | ICD-10-CM | POA: Diagnosis not present

## 2023-03-27 LAB — CBC WITH DIFFERENTIAL/PLATELET
Abs Immature Granulocytes: 0.19 10*3/uL — ABNORMAL HIGH (ref 0.00–0.07)
Basophils Absolute: 0 10*3/uL (ref 0.0–0.1)
Basophils Relative: 0 %
Eosinophils Absolute: 0.3 10*3/uL (ref 0.0–0.5)
Eosinophils Relative: 2 %
HCT: 23.5 % — ABNORMAL LOW (ref 39.0–52.0)
Hemoglobin: 8.3 g/dL — ABNORMAL LOW (ref 13.0–17.0)
Immature Granulocytes: 2 %
Lymphocytes Relative: 17 %
Lymphs Abs: 2.2 10*3/uL (ref 0.7–4.0)
MCH: 31.9 pg (ref 26.0–34.0)
MCHC: 35.3 g/dL (ref 30.0–36.0)
MCV: 90.4 fL (ref 80.0–100.0)
Monocytes Absolute: 1 10*3/uL (ref 0.1–1.0)
Monocytes Relative: 8 %
Neutro Abs: 9 10*3/uL — ABNORMAL HIGH (ref 1.7–7.7)
Neutrophils Relative %: 71 %
Platelets: 420 10*3/uL — ABNORMAL HIGH (ref 150–400)
RBC: 2.6 MIL/uL — ABNORMAL LOW (ref 4.22–5.81)
RDW: 15.4 % (ref 11.5–15.5)
WBC: 12.7 10*3/uL — ABNORMAL HIGH (ref 4.0–10.5)
nRBC: 0 % (ref 0.0–0.2)

## 2023-03-27 LAB — BASIC METABOLIC PANEL
Anion gap: 9 (ref 5–15)
BUN: 7 mg/dL (ref 6–20)
CO2: 25 mmol/L (ref 22–32)
Calcium: 7.9 mg/dL — ABNORMAL LOW (ref 8.9–10.3)
Chloride: 99 mmol/L (ref 98–111)
Creatinine, Ser: 0.83 mg/dL (ref 0.61–1.24)
GFR, Estimated: >60 mL/min (ref >60)
Glucose, Bld: 100 mg/dL — ABNORMAL HIGH (ref 70–99)
Potassium: 3.9 mmol/L (ref 3.5–5.1)
Sodium: 133 mmol/L — ABNORMAL LOW (ref 135–145)

## 2023-03-27 MED ORDER — PSYLLIUM 95 % PO PACK
1.0000 | PACK | Freq: Once | ORAL | Status: AC
  Administered 2023-03-27: 1 via ORAL
  Filled 2023-03-27: qty 1

## 2023-03-27 MED ORDER — PSYLLIUM 95 % PO PACK: 1.0000 | PACK | Freq: Every day | ORAL | Status: DC

## 2023-03-27 MED ORDER — ORAL CARE MOUTH RINSE
15.0000 mL | OROMUCOSAL | Status: DC | PRN
Start: 2023-03-27 — End: 2023-03-27

## 2023-03-27 MED ORDER — PANTOPRAZOLE SODIUM 40 MG PO TBEC: 40.0000 mg | DELAYED_RELEASE_TABLET | Freq: Two times a day (BID) | ORAL | 1 refills | Status: DC

## 2023-03-27 NOTE — Discharge Summary (Signed)
 Physician Discharge Summary   Patient: Justin Short MRN: [id number] DOB: 1975/06/12  Admit date:     03/24/2023  Discharge date: 03/27/23  Discharge Physician: Delon Herald   PCP: Antonette Angeline ORN, NP   Recommendations at discharge:   Continue Protonix  (pantoprazole ) twice daily for 8 weeks; if you continue to take NSAIDs and/or drink alcohol, continue once daily Protonix  indefinitely Imaging shows that one of your kidneys chronically doesn't work; avoid NSAIDs if possible to protect the other one Follow up with NP Baity in 1-2 weeks Follow up with GI to schedule colonoscopy  Discharge Diagnoses: Principal Problem:   UGIB (upper gastrointestinal bleed) Active Problems:   Acute blood loss anemia   Umbilical hernia without obstruction and without gangrene   Hyponatremia   Solitary kidney, congenital    Hospital Course: 47yo with h/o hemorrhoids s/p hemorrhoidectomy, solitary kidney, and umbilical hernia s/p repair on 12/19 who presented on 12/28 with UGI bleeding from NSAIDs. He underwent EGD on 12/29 and was found to have 2 non-bleeding duodenal ulcers, one with a visible vessel that was injected and cauterized. He is recommended to have IV PPI through 12/31 and then will need BID PPI for 8 weeks.   Assessment and Plan:  UGIB (upper gastrointestinal bleed) Recurrent black stools x 1 week with worsening weakness and fatigue as well as tachycardia in the setting of high-dose NSAID use s/p surgical repair of umbilical hernia Hemoglobin 15 -> 6.3 Transfused 3 units PRBC HOLD NSAIDs EGD with 2 ulcers, 1 with visible vessel s/p cautery and injection IV PPI BID x 48 hours -> PO PPI BID  x 8 weeks   Acute blood loss anemia Hemoglobin 15 -> 6.3 in the setting of upper GI bleeding x 1 week associated with high-dose NSAID use S/p PRBC transfusion Hgb is now stable   Solitary kidney, congenital Noted solitary kidney on imaging Discussed importance of nephrotoxic agents  including NSAIDs Normal renal function   Hyponatremia Sodium 127 -> 131 Hypovolemic in etiology Improving with hydration   Umbilical hernia without obstruction and without gangrene S/p Robotic assisted Umbilical Hernia Repair with mesh and plication of diastasis recti with Dr. Desiderio on 12/19  Incisions appear stable          Consultants: GI   Procedures: EGD 12/29   Antibiotics: None   Pain control - [place]  Controlled Substance Reporting System database was reviewed. and patient was instructed, not to drive, operate heavy machinery, perform activities at heights, swimming or participation in water activities or provide baby-sitting services while on Pain, Sleep and Anxiety Medications; until their outpatient Physician has advised to do so again. Also recommended to not to take more than prescribed Pain, Sleep and Anxiety Medications.   Disposition: Home Diet recommendation:  Regular diet DISCHARGE MEDICATION: Allergies as of 03/27/2023   No Known Allergies      Medication List     STOP taking these medications    ibuprofen  600 MG tablet Commonly known as: ADVIL        TAKE these medications    acetaminophen  500 MG tablet Commonly known as: TYLENOL  Take 2 tablets (1,000 mg total) by mouth every 6 (six) hours as needed for mild pain (pain score 1-3).   oxyCODONE  5 MG immediate release tablet Commonly known as: Oxy IR/ROXICODONE  Take 1 tablet (5 mg total) by mouth every 4 (four) hours as needed for severe pain (pain score 7-10).   pantoprazole  40 MG tablet Commonly known as: Protonix  Take 1 tablet (40  mg total) by mouth 2 (two) times daily.   psyllium 58.6 % powder Commonly known as: METAMUCIL Take 1 packet by mouth daily.        Discharge Exam:   Subjective: Feeling ok, concerned about not having a BM but had a brown-black BM after our encounter today.  Reports ongoing pleuritic CP at the left lower ribcage and wants to get a  CXR.   Objective: Vitals:   03/27/23 0008 03/27/23 0846  BP: (!) 143/77 117/66  Pulse: 88 88  Resp: 20 16  Temp: 98.3 F (36.8 C) 98.3 F (36.8 C)  SpO2: 100% 100%   No intake or output data in the 24 hours ending 03/27/23 1503 Filed Weights   03/24/23 1208  Weight: 59 kg    Exam:   General:  Appears calm and comfortable and is in NAD Eyes:  EOMI, normal lids, iris ENT:  grossly normal hearing, lips & tongue, mmm Neck:  no LAD, masses or thyromegaly Cardiovascular:  RRR, no m/r/g. No LE edema.  Respiratory:   CTA bilaterally with no wheezes/rales/rhonchi.  Normal respiratory effort. Abdomen:  soft, mild TTP around periumbilical/surgical region, ND Skin:  no rash or induration seen on limited exam Musculoskeletal:  grossly normal tone BUE/BLE, good ROM, no bony abnormality Psychiatric:  anxious mood and affect, speech fluent and appropriate, AOx3 Neurologic:  CN 2-12 grossly intact, moves all extremities in coordinated fashion  Data Reviewed: I have reviewed the patient's lab results since admission.  Pertinent labs for today include:  Na++ 133 WBC 12.7 Hgb 8.3, stable Platelets 420     Condition at discharge: improving  The results of significant diagnostics from this hospitalization (including imaging, microbiology, ancillary and laboratory) are listed below for reference.   Imaging Studies: DG Chest Port 1 View Result Date: 03/27/2023 CLINICAL DATA:  Pleuritic chest pain. EXAM: PORTABLE CHEST 1 VIEW COMPARISON:  October 20, 2004. FINDINGS: The heart size and mediastinal contours are within normal limits. Both lungs are clear. The visualized skeletal structures are unremarkable. IMPRESSION: No active disease. Electronically Signed   By: Lynwood Landy Raddle M.D.   On: 03/27/2023 14:11   CT ANGIO GI BLEED Result Date: 03/24/2023 CLINICAL DATA:  Recent umbilical hernia repair, possible GI bleed EXAM: CTA ABDOMEN AND PELVIS WITHOUT AND WITH CONTRAST TECHNIQUE:  Multidetector CT imaging of the abdomen and pelvis was performed using the standard protocol during bolus administration of intravenous contrast. Multiplanar reconstructed images and MIPs were obtained and reviewed to evaluate the vascular anatomy. RADIATION DOSE REDUCTION: This exam was performed according to the departmental dose-optimization program which includes automated exposure control, adjustment of the mA and/or kV according to patient size and/or use of iterative reconstruction technique. CONTRAST:  80mL OMNIPAQUE  IOHEXOL  350 MG/ML SOLN COMPARISON:  None Available. FINDINGS: VASCULAR Aorta: Normal caliber aorta without aneurysm, dissection, vasculitis or significant stenosis. Celiac: Patent without evidence of aneurysm, dissection, vasculitis or significant stenosis. SMA: Patent without evidence of aneurysm, dissection, vasculitis or significant stenosis. Renals: The right renal artery is patent without evidence of aneurysm, dissection, vasculitis, fibromuscular dysplasia or significant stenosis. Dysplastic left renal artery. IMA: Patent without evidence of aneurysm, dissection, vasculitis or significant stenosis. Inflow: Patent without evidence of aneurysm, dissection, vasculitis or significant stenosis. Proximal Outflow: Bilateral common femoral and visualized portions of the superficial and profunda femoral arteries are patent without evidence of aneurysm, dissection, vasculitis or significant stenosis. Veins: No focal venous abnormality. Review of the MIP images confirms the above findings. NON-VASCULAR Lower chest: The  intracardiac blood pool is hypodense relative to the adjacent myocardium consistent with anemia. Otherwise, no acute cardiopulmonary process. Hepatobiliary: No focal liver abnormality is seen. No gallstones, gallbladder wall thickening, or biliary dilatation. Pancreas: Unremarkable. No pancreatic ductal dilatation or surrounding inflammatory changes. Spleen: Normal in size without  focal abnormality. Adrenals/Urinary Tract: Adrenal glands are unremarkable. The right kidney is normal without renal calculi, focal lesion, or hydronephrosis. 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 Bladder is unremarkable. Stomach/Bowel: Stomach is within normal limits. Appendix appears normal. No evidence of bowel wall thickening, distention, or inflammatory changes. No evidence of contrast extravasation to suggest acute GI bleeding. Lymphatic: No suspicious lymphadenopathy. Reproductive: Prostate is unremarkable. Other: No abdominal wall hernia or abnormality. No abdominopelvic ascites. Postsurgical changes of umbilical hernia repair without evidence of complication. Musculoskeletal: No acute or significant osseous findings. IMPRESSION: 1. The intracardiac blood pool is hypodense relative to the adjacent myocardium consistent with anemia. 2. No evidence of active GI bleeding at this time. 3. Surgical changes of umbilical hernia repair without evidence of complication. 4. 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 Electronically Signed   By: Wilkie Lent M.D.   On: 03/24/2023 13:42    Microbiology: Results for orders placed or performed in visit on 10/02/18  Novel Coronavirus, NAA ([facility])     Status: None   Collection Time: 10/02/18 10:29 AM  Result Value Ref Range Status   SARS-CoV-2, NAA Not Detected Not Detected Final    Comment: Testing was performed using the cobas(R) SARS-CoV-2 test. This test was developed and its performance characteristics determined by [employer]. This test has not been FDA cleared or approved. This test has been authorized by FDA under an Emergency Use Authorization (EUA). This test is only authorized for the duration of time the declaration that circumstances exist justifying the authorization of the emergency use of in vitro diagnostic tests for detection of SARS-CoV-2 virus and/or diagnosis of COVID-19  infection under section 564(b)(1) of the Act, 21 U.S.C. 639aaa-6(a)(8), unless the authorization is terminated or revoked sooner. When diagnostic testing is negative, the possibility of a false negative result should be considered in the context of a patient's recent exposures and the presence of clinical signs and symptoms consistent with COVID-19. An individual without symptoms of COVID-19 and who is not shedding SARS-CoV-2 virus would expect to have a negati ve (not detected) result in this assay.     Labs: CBC: Recent Labs  Lab 03/24/23 1212 03/24/23 2136 03/25/23 1558 03/25/23 1838 03/26/23 0420 03/26/23 1832 03/27/23 0512  WBC 12.6*  --   --   --  9.7 12.3* 12.7*  NEUTROABS  --   --   --   --   --  7.7 9.0*  HGB 6.3*   < > 8.3* 8.0* 7.9* 8.4* 8.3*  HCT 18.2*   < > 23.8* 23.0* 23.0* 24.4* 23.5*  MCV 90.5  --   --   --  90.9 89.1 90.4  PLT 405*  --   --   --  331 422* 420*   < > = values in this interval not displayed.   Basic Metabolic Panel: Recent Labs  Lab 03/24/23 1212 03/24/23 1528 03/24/23 2136 03/25/23 0443 03/27/23 0512  NA 127* 129* 130* 131* 133*  K 3.7  --   --  3.6 3.9  CL 94*  --   --  102 99  CO2 23  --   --  24 25  GLUCOSE 143*  --   --  96 100*  BUN 25*  --   --  16 7  CREATININE 0.95  --   --  0.85 0.83  CALCIUM 8.2*  --   --  7.4* 7.9*   Liver Function Tests: Recent Labs  Lab 03/24/23 1212 03/25/23 0443  AST 32 23  ALT 54* 33  ALKPHOS 55 39  BILITOT 0.5 0.6  PROT 5.5* 4.1*  ALBUMIN 2.8* 2.1*   CBG: No results for input(s): GLUCAP in the last 168 hours.  Discharge time spent: greater than 30 minutes.  Signed: Delon Herald, MD [facility] 03/27/2023

## 2023-03-27 NOTE — TOC CM/SW Note (Signed)
 Transition of Care [facility]) - Inpatient Brief Assessment   Patient Details  Name: Justin Short MRN: [id number] Date of Birth: Jul 06, 1975  [facility] CM/SW Contact:    Lauraine JAYSON Carpen, LCSW Phone Number: 03/27/2023, 10:38 AM   Clinical Narrative: CSW reviewed chart. No TOC needs identified at this time. CSW will continue to follow progress. Please place [facility] consult if any needs arise.  Transition of Care Asessment: Insurance and Status: Insurance coverage has been reviewed Patient has primary care physician: Yes Home environment has been reviewed: Single family home Prior level of function:: Not documented Prior/Current Home Services: No current home services Social Drivers of Health Review: SDOH reviewed no interventions necessary Readmission risk has been reviewed: Yes Transition of care needs: no transition of care needs at this time

## 2023-03-27 NOTE — Hospital Course (Signed)
 54bn with h/o hemorrhoids s/p hemorrhoidectomy, solitary kidney, and umbilical hernia s/p repair on 12/19 who presented on 12/28 with UGI bleeding from NSAIDs. He underwent EGD on 12/29 and was found to have 2 non-bleeding duodenal ulcers, one with a visible vessel that was injected and cauterized. He is recommended to have IV PPI through 12/31 and then will need BID PPI for 8 weeks.

## 2023-03-30 ENCOUNTER — Encounter: Payer: Self-pay | Admitting: Surgery

## 2023-03-30 ENCOUNTER — Ambulatory Visit (INDEPENDENT_AMBULATORY_CARE_PROVIDER_SITE_OTHER): Payer: 59 | Admitting: Surgery

## 2023-03-30 VITALS — BP 104/66 | HR 90 | Temp 98.2°F | Ht 64.0 in | Wt 137.0 lb

## 2023-03-30 DIAGNOSIS — Z09 Encounter for follow-up examination after completed treatment for conditions other than malignant neoplasm: Secondary | ICD-10-CM | POA: Diagnosis not present

## 2023-03-30 DIAGNOSIS — M6208 Separation of muscle (nontraumatic), other site: Secondary | ICD-10-CM | POA: Diagnosis not present

## 2023-03-30 DIAGNOSIS — K429 Umbilical hernia without obstruction or gangrene: Secondary | ICD-10-CM | POA: Diagnosis not present

## 2023-03-30 NOTE — Progress Notes (Signed)
 03/30/2023  History of Present Illness: Justin Short is a 48 y.o. male status post robotic assisted umbilical hernia repair with plication of diastases recti on 03/15/2023.  Patient presents today for follow-up.  Patient was recently admitted after surgery on 03/24/2023 due to GI bleed.  His hemoglobin had dropped from 15 in June 2024 down to 6.3 on 03/24/2023 on admission.  He required 3 units transfusion and had an EGD with gastroenterology which showed 2 ulcers, 1 of which had a visible vessel status post cauterization and injection.  The patient overall stabilized and was able to be discharged on 03/27/2023.  From the abdominal standpoint, the patient reports that he is doing better and denies any worsening abdominal pain.  He does report feeling tired and weaker but otherwise has been improving.  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)but when he went back to pcp said it looks as though pt has both functioning kidneys     Past Surgical History: Past Surgical History:  Procedure Laterality Date   ESOPHAGOGASTRODUODENOSCOPY (EGD) WITH PROPOFOL  N/A 03/25/2023   Procedure: ESOPHAGOGASTRODUODENOSCOPY (EGD) WITH PROPOFOL ;  Surgeon: Maryruth Ole DASEN, MD;  Location: [facility] ENDOSCOPY;  Service: Endoscopy;  Laterality: N/A;   EVALUATION UNDER ANESTHESIA WITH HEMORRHOIDECTOMY N/A 04/11/2021   Procedure: EXAM UNDER ANESTHESIA WITH HEMORRHOIDECTOMY;  Surgeon: Desiderio Schanz, MD;  Location: [facility] ORS;  Service: General;  Laterality: N/A;   GANGLION CYST EXCISION Left    pinky finger   HOT HEMOSTASIS  03/25/2023   Procedure: HOT HEMOSTASIS (ARGON PLASMA COAGULATION/BICAP);  Surgeon: Maryruth Ole DASEN, MD;  Location: [facility] ENDOSCOPY;  Service: Endoscopy;;   INSERTION OF MESH  03/15/2023   Procedure: INSERTION OF MESH;   Surgeon: Desiderio Schanz, MD;  Location: [facility] ORS;  Service: General;;  Umbilical   sphincterotomy  2006   lateral internal sphincterotomy for anal fissure   XI ROBOTIC ASSISTED VENTRAL HERNIA N/A 03/15/2023   Procedure: XI ROBOTIC ASSISTED UMBILICAL REPAIR HERNIA with plication of diastasis recti;  Surgeon: Desiderio Schanz, MD;  Location: [facility] ORS;  Service: General;  Laterality: N/A;  umbilical    Home Medications: Prior to Admission medications   Medication Sig Start Date End Date Taking? Authorizing Provider  acetaminophen  (TYLENOL ) 500 MG tablet Take 2 tablets (1,000 mg total) by mouth every 6 (six) hours as needed for mild pain (pain score 1-3). 03/15/23  Yes Meghin Thivierge, Schanz, MD  pantoprazole  (PROTONIX ) 40 MG tablet Take 1 tablet (40 mg total) by mouth 2 (two) times daily. 03/27/23 05/26/23 Yes Barbarann Nest, MD  psyllium (METAMUCIL) 58.6 % powder Take 1 packet by mouth daily.   Yes [provider]  oxyCODONE  (OXY IR/ROXICODONE ) 5 MG immediate release tablet Take 1 tablet (5 mg total) by mouth every 4 (four) hours as needed for severe pain (pain score 7-10). Patient not taking: Reported on 03/30/2023 03/15/23   Desiderio Schanz, MD    Allergies: No Known Allergies  Review of Systems: Review of Systems  Constitutional:  Negative for chills and fever.  Respiratory:  Negative for shortness of breath.   Cardiovascular:  Negative for chest pain.  Gastrointestinal:  Negative for abdominal pain, nausea and vomiting.    Physical Exam BP 104/66   Pulse 90   Temp 98.2 F (  36.8 C)   Ht 5' 4 (1.626 m)   Wt 137 lb (62.1 kg)   SpO2 99%   BMI 23.52 kg/m  CONSTITUTIONAL: No acute distress HEENT:  Normocephalic, atraumatic, extraocular motion intact. RESPIRATORY:  Normal respiratory effort without pathologic use of accessory muscles. CARDIOVASCULAR: Regular rhythm and rate. GI: The abdomen is soft, nondistended, appropriately tender to palpation.  Incisions are clean, dry, intact.  No  evidence of hernia recurrence, and diastases as well plicated.  NEUROLOGIC:  Motor and sensation is grossly normal.  Cranial nerves are grossly intact. PSYCH:  Alert and oriented to person, place and time. Affect is normal.   Assessment and Plan: This is a 48 y.o. male status post robotic assisted umbilical hernia repair with plication of diastases recti.  - Discussed with patient that is not uncommon to feel weaker and more tired given his drop in hemoglobin.  His hemoglobin was 8.3 on discharge and discussed with him that he will take some time for his numbers to fully replete.  His energy level should be improving as his anemia continues to improve. - From the abdominal standpoint, he is doing well on without any evidence of hernia recurrence or complications from his surgery. - Reminded patient of activity restrictions. - Follow-up as needed.  I spent 20 minutes dedicated to the care of this patient on the date of this encounter to include pre-visit review of records, face-to-face time with the patient discussing diagnosis and management, and any post-visit coordination of care.   Aloysius Sheree Plant, MD [facility]

## 2023-03-30 NOTE — Patient Instructions (Addendum)
 GENERAL POST-OPERATIVE PATIENT INSTRUCTIONS   WOUND CARE INSTRUCTIONS:  Try to keep the wound dry and avoid ointments on the wound unless directed to do so.  If the wound becomes bright red and painful or starts to drain infected material that is not clear, please contact your physician immediately.  If the wound is mildly pink and has a thick firm ridge underneath it, this is normal, and is referred to as a healing ridge.  This will resolve over the next 4-6 weeks.  BATHING: You may shower if you have been informed of this by your surgeon. However, Please do not submerge in a tub, hot tub, or pool until incisions are completely sealed or have been told by your surgeon that you may do so.  DIET:  You may eat any foods that you can tolerate.  It is a good idea to eat a high fiber diet and take in plenty of fluids to prevent constipation.  If you do become constipated you may want to take a mild laxative or take ducolax tablets on a daily basis until your bowel habits are regular.  Constipation can be very uncomfortable, along with straining, after recent surgery.  ACTIVITY:  You are encouraged to walk and engage in light activity for the next two weeks.  You should not lift more than 20 pounds for 6 weeks total after surgery as it could put you at increased risk for complications.  Twenty pounds is roughly equivalent to a plastic bag of groceries. At that time- Listen to your body when lifting, if you have pain when lifting, stop and then try again in a few days. Soreness after doing exercises or activities of daily living is normal as you get back in to your normal routine.  MEDICATIONS:  Try to take narcotic medications and anti-inflammatory medications, such as tylenol, ibuprofen, naprosyn, etc., with food.  This will minimize stomach upset from the medication.  Should you develop nausea and vomiting from the pain medication, or develop a rash, please discontinue the medication and contact your  physician.  You should not drive, make important decisions, or operate machinery when taking narcotic pain medication.  SUNBLOCK Use sun block to incision area over the next year if this area will be exposed to sun. This helps decrease scarring and will allow you avoid a permanent darkened area over your incision.  QUESTIONS:  Please feel free to call our office if you have any questions, and we will be glad to assist you. [phone]

## 2023-04-09 ENCOUNTER — Telehealth: Payer: Self-pay | Admitting: Surgery

## 2023-04-09 NOTE — Telephone Encounter (Signed)
 Patient wants to speak directly to Dr. Desiderio.  He had hemorroidectomy done about 2 years ago.  Just recently had a very hard bowel movement and bright red blood.  He wants to make sure that he is doing everything that he is suppose to do to prevent any future problems with this.  Please call him. Thank you.

## 2023-04-17 ENCOUNTER — Telehealth: Payer: Self-pay | Admitting: *Deleted

## 2023-04-17 NOTE — Telephone Encounter (Signed)
Faxed FMLA to [employer] at [phone] 

## 2023-04-18 ENCOUNTER — Ambulatory Visit: Payer: 59 | Admitting: Surgery

## 2023-04-18 ENCOUNTER — Encounter: Payer: Self-pay | Admitting: Surgery

## 2023-04-18 VITALS — BP 116/75 | HR 93 | Temp 98.6°F | Ht 64.0 in | Wt 138.8 lb

## 2023-04-18 DIAGNOSIS — M6208 Separation of muscle (nontraumatic), other site: Secondary | ICD-10-CM | POA: Diagnosis not present

## 2023-04-18 DIAGNOSIS — K922 Gastrointestinal hemorrhage, unspecified: Secondary | ICD-10-CM | POA: Diagnosis not present

## 2023-04-18 DIAGNOSIS — K644 Residual hemorrhoidal skin tags: Secondary | ICD-10-CM

## 2023-04-18 DIAGNOSIS — K429 Umbilical hernia without obstruction or gangrene: Secondary | ICD-10-CM | POA: Diagnosis not present

## 2023-04-18 NOTE — Patient Instructions (Signed)
Rectal Bleeding    Rectal bleeding is when blood comes out of the opening of the butt (anus). You may see bright red blood in your underwear or in the toilet after you poop (have a bowel movement). You may also have blood mixed with your poop (stool), or dark red or black poop.  Rectal bleeding is often a sign that something is wrong. It can be caused by many things. It needs to be checked by a doctor.  Follow these instructions at home:  Medicines  Take over-the-counter and prescription medicines only as told by your doctor.  Ask your doctor about changing or stopping your normal medicines. These include blood thinners.  Managing constipation    Your condition may cause trouble pooping (constipation). To prevent or treat this, or to help make your poop soft, you may need to:  Drink enough fluid to keep your pee (urine) pale yellow.  Take over-the-counter or prescription medicines.  Eat foods that are high in fiber. These include beans, whole grains, and fresh fruits and vegetables.  Limit foods that are high in fat and sugar. These include fried or sweet foods.     General instructions  Try not to strain when you poop.  Take a warm bath. This may help with pain.  Watch for changes in your symptoms.  Contact a doctor if:  You have pain or swelling in your belly (abdomen).  You have a fever.  You feel weak or like you may vomit.  You cannot poop.  You have new or more bleeding.  You have black or dark red poop.  You vomit blood or something that looks like coffee grounds.  Get help right away if:  You faint.  You have very bad pain in your butt.  These symptoms may be an emergency. Get help right away. Call 911.  Do not wait to see if the symptoms will go away.  Do not drive yourself to the hospital.  This information is not intended to replace advice given to you by your health care provider. Make sure you discuss any questions you have with your health care provider.  Document Revised: 11/01/2021 Document Reviewed:  11/01/2021  Elsevier Patient Education  2024 [employer].

## 2023-04-18 NOTE — Progress Notes (Signed)
04/18/2023  History of Present Illness: Justin Short is a 48 y.o. male presenting for follow up.  He's s/p robotic assisted umbilical hernia repair with plication of diastasis recti on 03/15/23.  He had an upper GI bleed episode on 03/24/23 from NSAID use after surgery and EGD showed two duodenal ulcers.  He required blood transfusion.  He had repeat labs yesterday and his Hgb has gone up from 8.3 on discharge from hospital to 11.3 yesterday.  He also reports having had issues with constipation after his surgery which resulted in some bright red blood per rectum.  He has a history of hemorrhoids and had an EUA and hemorrhoidectomy on 04/11/21.  He started Miralax and using lidocaine ointment and he reports continued improvement in his perianal pain and blood.  He has not had any bright red blood in 3-4 days, and the pain is also improving.  He is worried that he may be developing another enlarged hemorrhoid anteriorly.  He's otherwise recovering as expected from his hernia repair, and reports some soreness/pulling in the LUQ incision going towards the umbilicus.  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)but when he went back to pcp said it looks as though pt has both functioning kidneys     Past Surgical History: Past Surgical History:  Procedure Laterality Date   ESOPHAGOGASTRODUODENOSCOPY (EGD) WITH PROPOFOL N/A 03/25/2023   Procedure: ESOPHAGOGASTRODUODENOSCOPY (EGD) WITH PROPOFOL;  Surgeon: Regis Bill, MD;  Location: [facility] ENDOSCOPY;  Service: Endoscopy;  Laterality: N/A;   EVALUATION UNDER ANESTHESIA WITH HEMORRHOIDECTOMY N/A 04/11/2021   Procedure: EXAM UNDER ANESTHESIA WITH HEMORRHOIDECTOMY;  Surgeon: Henrene Dodge, MD;  Location: [facility] ORS;  Service: General;  Laterality: N/A;   GANGLION CYST  EXCISION Left    pinky finger   HOT HEMOSTASIS  03/25/2023   Procedure: HOT HEMOSTASIS (ARGON PLASMA COAGULATION/BICAP);  Surgeon: Regis Bill, MD;  Location: [facility] ENDOSCOPY;  Service: Endoscopy;;   INSERTION OF MESH  03/15/2023   Procedure: INSERTION OF MESH;  Surgeon: Henrene Dodge, MD;  Location: [facility] ORS;  Service: General;;  Umbilical   sphincterotomy  2006   lateral internal sphincterotomy for anal fissure   XI ROBOTIC ASSISTED VENTRAL HERNIA N/A 03/15/2023   Procedure: XI ROBOTIC ASSISTED UMBILICAL REPAIR HERNIA with plication of diastasis recti;  Surgeon: Henrene Dodge, MD;  Location: [facility] ORS;  Service: General;  Laterality: N/A;  umbilical    Home Medications: Prior to Admission medications   Medication Sig Start Date End Date Taking? Authorizing Provider  acetaminophen (TYLENOL) 500 MG tablet Take 2 tablets (1,000 mg total) by mouth every 6 (six) hours as needed for mild pain (pain score 1-3). 03/15/23  Yes Austen Oyster, Elita Quick, MD  pantoprazole (PROTONIX) 40 MG tablet Take 1 tablet (40 mg total) by mouth 2 (two) times daily. 03/27/23 05/26/23 Yes Jonah Blue, MD  psyllium (METAMUCIL) 58.6 % powder Take 1 packet by mouth daily.   Yes [provider]    Allergies: No Known Allergies  Review of Systems: Review of Systems  Constitutional:  Negative for chills and fever.  Respiratory:  Negative for shortness of breath.   Cardiovascular:  Negative for chest pain.  Gastrointestinal:  Positive for abdominal pain (discomfort in LUQ when coughing/sneezing). Negative for nausea and  vomiting.    Physical Exam BP 116/75   Pulse 93   Temp 98.6 F (37 C) (Oral)   Ht 5\' 4"  (1.626 m)   Wt 138 lb 12.8 oz (63 kg)   SpO2 97%   BMI 23.82 kg/m  CONSTITUTIONAL: No acute distress HEENT:  Normocephalic, atraumatic, extraocular motion intact. RESPIRATORY:  Normal respiratory effort without pathologic use of accessory muscles. CARDIOVASCULAR: Regular rhythm and rate RECTAL:   External exam does not reveal any tears in the perianal skin, or enlarged hemorrhoidal tissue.  No erythema or induration.  He has some tenderness anteriorly. NEUROLOGIC:  Motor and sensation is grossly normal.  Cranial nerves are grossly intact. PSYCH:  Alert and oriented to person, place and time. Affect is normal.  Labs/Imaging: Labs from 04/17/23: WBC 8.8, Hgb 11.3, Hct 35.4, Plt 417.   Assessment and Plan: This is a 48 y.o. male s/p robotic assisted umbilical hernia repair with plication of diastasis recti, complicated by upper GI bleed from NSAID use for pain control and perianal pain and BRBPR from constipation.  --Discussed with the patient that despite of the setbacks he has had after surgery, he is overall recovering appropriately.  His Hgb has improved 3 points in 3 weeks.   --His abdominal discomfort is also improving.  Discussed with him that pulling/soreness in the LUQ is likely related to the fascial closure done in the LUQ incision which is the largest.  This fascial closure unfortunately also pinches some of the muscles and causes the discomfort.  This will improve as the inflammation resolves and sutures dissolve.  No concerns at this point for hernia recurrence.   --His perianal pain and bleeding has also improved with the use of Miralax.  On exam today, there's no evidence of any inflamed or enlarged hemorrhoids, and no evidence of anal fissure.  Continue taking Miralax and can use lidocaine ointment to help with pain and if he wants, he can also use anusol ointment in case there is still any residual inflammation from the constipation episode. --Follow up as needed.  I spent 20 minutes dedicated to the care of this patient on the date of this encounter to include pre-visit review of records, face-to-face time with the patient discussing diagnosis and management, and any post-visit coordination of care.   Howie Ill, MD [facility]

## 2023-06-13 ENCOUNTER — Ambulatory Visit (INDEPENDENT_AMBULATORY_CARE_PROVIDER_SITE_OTHER): Admitting: Surgery

## 2023-06-13 ENCOUNTER — Encounter: Payer: Self-pay | Admitting: Surgery

## 2023-06-13 VITALS — BP 111/74 | HR 57 | Temp 98.4°F | Ht 64.0 in | Wt 145.2 lb

## 2023-06-13 DIAGNOSIS — Z09 Encounter for follow-up examination after completed treatment for conditions other than malignant neoplasm: Secondary | ICD-10-CM | POA: Diagnosis not present

## 2023-06-13 DIAGNOSIS — K429 Umbilical hernia without obstruction or gangrene: Secondary | ICD-10-CM

## 2023-06-13 DIAGNOSIS — M6208 Separation of muscle (nontraumatic), other site: Secondary | ICD-10-CM

## 2023-06-13 NOTE — Patient Instructions (Signed)
 Umbilical Hernia, Adult  A hernia is a lump of tissue that pushes through an opening in the muscles. An umbilical hernia happens in the belly, near the belly button. The hernia may contain tissues from the small or large intestine. It may also have fatty tissue that covers the intestines. Umbilical hernias in adults may get worse over time. They need to be treated with surgery. There are several types of umbilical hernias. They include: Indirect hernia. This occurs just above or below the belly button. It's the most common type of umbilical hernia in adults. Direct hernia. This type occurs in an opening that's formed by the belly button. Reducible hernia. This hernia comes and goes. You may see it only when you strain, cough, or lift something heavy. This type of hernia can be pushed back into the belly (reduced). Incarcerated hernia. This traps the hernia in the wall of the belly. This type of hernia can't be pushed back into the belly. It can cause a strangulated hernia. Strangulated hernia. This hernia cuts off blood flow to the tissues inside the hernia. The tissues can die if this happens. This type of hernia must be treated right away. What are the causes? An umbilical hernia happens when tissue inside the belly pushes through an opening in the muscles of the belly. What increases the risk? You're more likely to get this hernia if: You strain while lifting or pushing heavy objects. You've had several pregnancies. You have a condition that puts pressure on your belly, and you've had it for a long time. These include: Obesity. A buildup of fluid inside your belly. Vomiting or coughing all the time. Trouble pooping (constipation). You've had surgery that weakened the muscles in the belly. What are the signs or symptoms? The main symptom of this condition is a bulge at the belly button or near it. The bulge does not cause pain. Other symptoms depend on the type of hernia you have. A  reducible hernia may be seen only when you strain, cough, or lift something heavy. Other symptoms may include: Dull pain. A feeling of pressure. An incarcerated hernia may cause very bad pain. Also, you may: Vomit or feel like you may vomit. Not be able to pass gas. A strangulated hernia may cause: Pain that gets worse and worse. Vomiting, or feeling like you may vomit. Pain when you press on the hernia. Change of color on the skin over the hernia. The skin may become red or purple. Trouble pooping. Blood in the poop. How is this diagnosed? This condition may be diagnosed based on: Your symptoms and medical history. A physical exam. You may be asked to cough or strain while standing. These actions will put pressure inside your belly. The pressure can force the hernia through the opening in your muscles. Your health care provider may try to push the hernia back into your belly (reduce). How is this treated? Surgery is the only treatment for an umbilical hernia. Surgery for a strangulated hernia must be done right away. If you have a small hernia that's not incarcerated, you may need to lose weight before the surgery is done. Follow these instructions at home: Managing constipation You may need to take these actions to prevent trouble pooping. This will help to prevent straining. Drink enough fluid to keep your pee (urine) pale yellow. Take over-the-counter or prescription medicines. Eat foods that are high in fiber, such as beans, whole grains, and fresh fruits and vegetables. Limit foods that are high in  fat and sugars, such as fried or sweet foods. General instructions Do not try to push the hernia back in. Lose weight, if told by your provider. Watch your hernia for any changes in color or size. Tell your provider if any changes occur. You may need to avoid activities that put pressure on your hernia. You may have to avoid lifting. Ask your provider how much you can safely  lift. Take over-the-counter and prescription medicines only as told by your provider. Contact a health care provider if: Your hernia gets larger or feels hard. Your hernia becomes painful. You get a fever or chills. Get help right away if: You get very bad pain near the area of the hernia, and the pain comes on suddenly. You have pain and you vomit or feel like you may vomit. The skin over your hernia changes color. These symptoms may be an emergency. Get help right away. Call 911. Do not wait to see if the symptoms go away. Do not drive yourself to the hospital. This information is not intended to replace advice given to you by your health care provider. Make sure you discuss any questions you have with your health care provider. Document Revised: 07/04/2022 Document Reviewed: 07/04/2022 Elsevier Patient Education  2024 [employer].

## 2023-06-13 NOTE — Progress Notes (Signed)
 06/13/2023  History of Present Illness: Justin Short is a 48 y.o. male status post robotic assisted umbilical hernia repair with plication of diastases recti on 03/15/2023.  Patient presents today for follow-up.  He reports that recently she started feeling a small bump at the area just superior of the umbilicus associated with some mild discomfort.  He has not noticed a specific bulging but reports having some discomfort in the area.  Otherwise denies any other concerns but given his history, he wanted to be checked out to make sure there is no recurrence.  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)but when he went back to pcp said it looks as though pt has both functioning kidneys     Past Surgical History: Past Surgical History:  Procedure Laterality Date   ESOPHAGOGASTRODUODENOSCOPY (EGD) WITH PROPOFOL N/A 03/25/2023   Procedure: ESOPHAGOGASTRODUODENOSCOPY (EGD) WITH PROPOFOL;  Surgeon: Regis Bill, MD;  Location: [facility] ENDOSCOPY;  Service: Endoscopy;  Laterality: N/A;   EVALUATION UNDER ANESTHESIA WITH HEMORRHOIDECTOMY N/A 04/11/2021   Procedure: EXAM UNDER ANESTHESIA WITH HEMORRHOIDECTOMY;  Surgeon: Henrene Dodge, MD;  Location: [facility] ORS;  Service: General;  Laterality: N/A;   GANGLION CYST EXCISION Left    pinky finger   HOT HEMOSTASIS  03/25/2023   Procedure: HOT HEMOSTASIS (ARGON PLASMA COAGULATION/BICAP);  Surgeon: Regis Bill, MD;  Location: [facility] ENDOSCOPY;  Service: Endoscopy;;   INSERTION OF MESH  03/15/2023   Procedure: INSERTION OF MESH;  Surgeon: Henrene Dodge, MD;  Location: [facility] ORS;  Service: General;;  Umbilical   sphincterotomy  2006   lateral internal sphincterotomy for anal fissure   XI ROBOTIC ASSISTED VENTRAL HERNIA N/A 03/15/2023   Procedure: XI ROBOTIC ASSISTED  UMBILICAL REPAIR HERNIA with plication of diastasis recti;  Surgeon: Henrene Dodge, MD;  Location: [facility] ORS;  Service: General;  Laterality: N/A;  umbilical    Home Medications: Prior to Admission medications   Medication Sig Start Date End Date Taking? Authorizing Provider  acetaminophen (TYLENOL) 500 MG tablet Take 2 tablets (1,000 mg total) by mouth every 6 (six) hours as needed for mild pain (pain score 1-3). 03/15/23  Yes Kaleab Frasier, MD  psyllium (METAMUCIL) 58.6 % powder Take 1 packet by mouth daily.   Yes [provider]  pantoprazole (PROTONIX) 40 MG tablet Take 1 tablet (40 mg total) by mouth 2 (two) times daily. 03/27/23 05/26/23  Jonah Blue, MD    Allergies: No Known Allergies  Review of Systems: Review of Systems  Constitutional:  Negative for chills and fever.  Respiratory:  Negative for shortness of breath.   Cardiovascular:  Negative for chest pain.  Gastrointestinal:  Positive for abdominal pain. Negative for nausea and vomiting.    Physical Exam BP 111/74   Pulse (!) 57   Temp 98.4 F (36.9 C) (Oral)   Ht 5\' 4"  (1.626 m)   Wt 145 lb 3.2 oz (65.9 kg)   SpO2 96%   BMI 24.92 kg/m  CONSTITUTIONAL: No acute distress, well-nourished HEENT:  Normocephalic, atraumatic, extraocular motion intact. RESPIRATORY:  Normal respiratory effort without pathologic use of accessory muscles. CARDIOVASCULAR: Regular rhythm and rate. GI: The abdomen is soft, nondistended, nontender to palpation.  The patient has a small firm area at the superior portion  of the umbilicus which is likely consistent with scar tissue.  There is no evidence of hernia recurrence and no bulging sensation when straining or coughing.  No evidence of diastases recurrence at this point either.  All his incisions are well-healed.  NEUROLOGIC:  Motor and sensation is grossly normal.  Cranial nerves are grossly intact. PSYCH:  Alert and oriented to person, place and time. Affect is  normal.   Assessment and Plan: This is a 48 y.o. male status post robotic assisted umbilical hernia repair and plication of diastases recti.  - Discussed with the patient that on exam, there is no evidence of hernia recurrence.  Discussed with him that the diastases plication also appears to be intact.  There is no evidence of complications when he is coughing or straining.  Likely what he is feeling is residual scar tissue particular at the area of the hernia repair.  This may continue to improve with time but it may be that this is more chronic in nature given that he is now almost 3 months from surgery. - Otherwise discussed with patient that she currently does not have any activity restrictions and can start doing exercising and working out, ramping up his activity level to prevent any muscle injury. - Follow-up as needed.  I spent 10 minutes dedicated to the care of this patient on the date of this encounter to include pre-visit review of records, face-to-face time with the patient discussing diagnosis and management, and any post-visit coordination of care.   Howie Ill, MD [facility]

## 2023-06-15 ENCOUNTER — Encounter: Admitting: Surgery

## 2023-10-09 ENCOUNTER — Telehealth: Payer: Self-pay

## 2023-10-09 NOTE — Telephone Encounter (Signed)
 Copied from CRM [id number]. Topic: Clinical - Lab/Test Results >> Oct 09, 2023 10:21 AM Sophia H wrote: Reason for CRM: Patient is requesting orders for lab work for upcoming physical, wants full panel that insurance will cover.

## 2023-10-19 ENCOUNTER — Encounter
Admission: RE | Disposition: A | Discharge status: Home / Self Care | Payer: Self-pay | Source: Home / Self Care | Attending: Gastroenterology

## 2023-10-19 ENCOUNTER — Ambulatory Visit
Admission: RE | Admit: 2023-10-19 | Discharge: 2023-10-19 | Disposition: A | Discharge status: Home / Self Care | Attending: Gastroenterology | Admitting: Gastroenterology

## 2023-10-19 ENCOUNTER — Encounter: Payer: Self-pay | Admitting: Gastroenterology

## 2023-10-19 ENCOUNTER — Other Ambulatory Visit: Payer: Self-pay

## 2023-10-19 ENCOUNTER — Ambulatory Visit: Admitting: Anesthesiology

## 2023-10-19 ENCOUNTER — Ambulatory Visit: Admit: 2023-10-19 | Payer: 59

## 2023-10-19 DIAGNOSIS — K621 Rectal polyp: Secondary | ICD-10-CM | POA: Diagnosis not present

## 2023-10-19 DIAGNOSIS — K635 Polyp of colon: Secondary | ICD-10-CM | POA: Diagnosis not present

## 2023-10-19 DIAGNOSIS — F17200 Nicotine dependence, unspecified, uncomplicated: Secondary | ICD-10-CM | POA: Diagnosis not present

## 2023-10-19 DIAGNOSIS — Z1211 Encounter for screening for malignant neoplasm of colon: Secondary | ICD-10-CM | POA: Diagnosis present

## 2023-10-19 DIAGNOSIS — K5730 Diverticulosis of large intestine without perforation or abscess without bleeding: Secondary | ICD-10-CM | POA: Insufficient documentation

## 2023-10-19 HISTORY — PX: COLONOSCOPY: SHX5424

## 2023-10-19 HISTORY — PX: POLYPECTOMY: SHX149

## 2023-10-19 SURGERY — COLONOSCOPY WITH PROPOFOL
Anesthesia: General

## 2023-10-19 SURGERY — COLONOSCOPY
Anesthesia: General

## 2023-10-19 MED ORDER — PROPOFOL 10 MG/ML IV BOLUS
INTRAVENOUS | Status: DC | PRN
  Administered 2023-10-19 (×3): 50 mg via INTRAVENOUS

## 2023-10-19 MED ORDER — PROPOFOL 500 MG/50ML IV EMUL
INTRAVENOUS | Status: DC | PRN
  Administered 2023-10-19: 150 ug/kg/min via INTRAVENOUS

## 2023-10-19 MED ORDER — LIDOCAINE HCL (PF) 2 % IJ SOLN
INTRAMUSCULAR | Status: AC
Start: 2023-10-19 — End: 2023-10-19
  Filled 2023-10-19: qty 5

## 2023-10-19 MED ORDER — LIDOCAINE HCL (CARDIAC) PF 100 MG/5ML IV SOSY
PREFILLED_SYRINGE | INTRAVENOUS | Status: DC | PRN
  Administered 2023-10-19: 60 mg via INTRAVENOUS

## 2023-10-19 MED ORDER — SIMETHICONE 40 MG/0.6ML PO SUSP
ORAL | Status: DC | PRN
  Administered 2023-10-19: 60 mL

## 2023-10-19 MED ORDER — SODIUM CHLORIDE 0.9 % IV SOLN: INTRAVENOUS | Status: DC

## 2023-10-19 MED ORDER — PHENYLEPHRINE 80 MCG/ML (10ML) SYRINGE FOR IV PUSH (FOR BLOOD PRESSURE SUPPORT)
PREFILLED_SYRINGE | INTRAVENOUS | Status: AC
  Filled 2023-10-19: qty 10

## 2023-10-19 MED ORDER — DEXMEDETOMIDINE HCL IN NACL 80 MCG/20ML IV SOLN
INTRAVENOUS | Status: DC | PRN
Start: 2023-10-19 — End: 2023-10-19
  Administered 2023-10-19: 20 ug via INTRAVENOUS

## 2023-10-19 NOTE — Op Note (Signed)
 [facility] Gastroenterology Patient Name: Justin Short Procedure Date: 10/19/2023 11:12 AM MRN: [id number] Account #: [id number] Date of Birth: [birth year] Admit Type: Outpatient Age: 48 Room: [facility] ENDO ROOM 1 Gender: Male Note Status: Finalized Instrument Name: Peds Colonoscope [id number] Procedure:             Colonoscopy Indications:           Screening for colorectal malignant neoplasm Providers:             [name] DO, DO Referring MD:          Angeline MICAEL Laura (Referring MD) Medicines:             Monitored Anesthesia Care Complications:         No immediate complications. Estimated blood loss:                         Minimal. Procedure:             Pre-Anesthesia Assessment:                        - Prior to the procedure, a History and Physical was                         performed, and patient medications and allergies were                         reviewed. The patient is competent. The risks and                         benefits of the procedure and the sedation options and                         risks were discussed with the patient. All questions                         were answered and informed consent was obtained.                         Patient identification and proposed procedure were                         verified by the physician, the nurse, the anesthetist                         and the technician in the endoscopy suite. Mental                         Status Examination: alert and oriented. Airway                         Examination: normal oropharyngeal airway and neck                         mobility. Respiratory Examination: clear to                         auscultation. CV Examination: RRR, no murmurs, no S3  or S4. Prophylactic Antibiotics: The patient does not                         require prophylactic antibiotics. Prior                         Anticoagulants: The patient has taken no  anticoagulant                         or antiplatelet agents. ASA Grade Assessment: II - A                         patient with mild systemic disease. After reviewing                         the risks and benefits, the patient was deemed in                         satisfactory condition to undergo the procedure. The                         anesthesia plan was to use monitored anesthesia care                         (MAC). Immediately prior to administration of                         medications, the patient was re-assessed for adequacy                         to receive sedatives. The heart rate, respiratory                         rate, oxygen saturations, blood pressure, adequacy of                         pulmonary ventilation, and response to care were                         monitored throughout the procedure. The physical                         status of the patient was re-assessed after the                         procedure.                        After obtaining informed consent, the colonoscope was                         passed under direct vision. Throughout the procedure,                         the patient's blood pressure, pulse, and oxygen                         saturations were monitored continuously. The  Colonoscope was introduced through the anus and                         advanced to the the terminal ileum, with                         identification of the appendiceal orifice and IC                         valve. The colonoscopy was performed without                         difficulty. The patient tolerated the procedure well.                         The quality of the bowel preparation was evaluated                         using the BBPS [facility] Bowel Preparation Scale) with                         scores of: Right Colon = 2 (minor amount of residual                         staining, small fragments of stool and/or opaque                          liquid, but mucosa seen well), Transverse Colon = 2                         (minor amount of residual staining, small fragments of                         stool and/or opaque liquid, but mucosa seen well) and                         Left Colon = 3 (entire mucosa seen well with no                         residual staining, small fragments of stool or opaque                         liquid). The total BBPS score equals 7. The quality of                         the bowel preparation was good. The terminal ileum,                         ileocecal valve, appendiceal orifice, and rectum were                         photographed. Findings:      The perianal and digital rectal examinations were normal. Pertinent       negatives include normal sphincter tone.      The terminal ileum appeared normal. Estimated blood loss: none.      Retroflexion in the right colon was performed.  Three sessile polyps were found in the rectum (2) and ascending colon       (1). The polyps were 1 to 2 mm in size. These polyps were removed with a       jumbo cold forceps. Resection and retrieval were complete. Estimated       blood loss was minimal.      Multiple small-mouthed diverticula were found in the sigmoid colon.       Estimated blood loss: none.      The exam was otherwise without abnormality on direct and retroflexion       views. Impression:            - The examined portion of the ileum was normal.                        - Three 1 to 2 mm polyps in the rectum and in the                         ascending colon, removed with a jumbo cold forceps.                         Resected and retrieved.                        - Diverticulosis in the sigmoid colon.                        - The examination was otherwise normal on direct and                         retroflexion views. Recommendation:        - Patient has a contact number available for                         emergencies. The signs and symptoms of  potential                         delayed complications were discussed with the patient.                         Return to normal activities tomorrow. Written                         discharge instructions were provided to the patient.                        - Discharge patient to home.                        - Resume previous diet.                        - Continue present medications.                        - Await pathology results.                        - Repeat colonoscopy for surveillance based on  pathology results.                        - Return to referring physician as previously                         scheduled.                        - The findings and recommendations were discussed with                         the patient. Procedure Code(s):     --- Professional ---                        [phone], Colonoscopy, flexible; with biopsy, single or                         multiple Diagnosis Code(s):     --- Professional ---                        Z12.11, Encounter for screening for malignant neoplasm                         of colon                        D12.8, Benign neoplasm of rectum                        D12.2, Benign neoplasm of ascending colon                        K57.30, Diverticulosis of large intestine without                         perforation or abscess without bleeding CPT copyright 2022 American [employer] Association. All rights reserved. The codes documented in this report are preliminary and upon coder review may  be revised to meet current compliance requirements. Attending Participation:      I personally performed the entire procedure. Elspeth Jungling, DO [name] DO, DO 10/19/2023 11:52:31 AM This report has been signed electronically. Number of Addenda: 0 Note Initiated On: 10/19/2023 11:12 AM Scope Withdrawal Time: 0 hours 13 minutes 0 seconds  Total Procedure Duration: 0 hours 15 minutes 22 seconds  Estimated Blood  Loss:  Estimated blood loss was minimal.      [facility]

## 2023-10-19 NOTE — Interval H&P Note (Signed)
 History and Physical Interval Note: Preprocedure H&P from 10/19/23  was reviewed and there was no interval change after seeing and examining the patient.  Written consent was obtained from the patient after discussion of risks, benefits, and alternatives. Patient has consented to proceed with Colonoscopy with possible intervention   10/19/2023 11:26 AM  Justin Short  has presented today for surgery, with the diagnosis of CCA SCREEN.  The various methods of treatment have been discussed with the patient and family. After consideration of risks, benefits and other options for treatment, the patient has consented to  Procedure(s): COLONOSCOPY (N/A) as a surgical intervention.  The patient's history has been reviewed, patient examined, no change in status, stable for surgery.  I have reviewed the patient's chart and labs.  Questions were answered to the patient's satisfaction.     [name]

## 2023-10-19 NOTE — H&P (Signed)
 Pre-Procedure H&P   Patient ID: Justin Short is a 48 y.o. male.  Gastroenterology Provider: [name], DO  Referring Provider: Delmar Gails, NP PCP: Antonette Angeline ORN, NP  Date: 10/19/2023  HPI [name] is a 48 y.o. male who presents today for Colonoscopy for colorectal cancer screening .  Initial screening colonoscopy  Rare constipation.  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 ENDOSCOPY;  Service: Endoscopy;  Laterality: N/A;   EVALUATION UNDER ANESTHESIA WITH HEMORRHOIDECTOMY N/A 04/11/2021   Procedure: EXAM UNDER ANESTHESIA WITH HEMORRHOIDECTOMY;  Surgeon: Desiderio Schanz, MD;  Location: [facility] ORS;  Service: General;  Laterality: N/A;   GANGLION CYST EXCISION Left    pinky finger   HOT HEMOSTASIS  03/25/2023   Procedure: HOT HEMOSTASIS (ARGON PLASMA COAGULATION/BICAP);  Surgeon: Maryruth Ole DASEN, MD;  Location: [facility] ENDOSCOPY;  Service: Endoscopy;;   INSERTION OF MESH  03/15/2023   Procedure: INSERTION OF MESH;  Surgeon: Desiderio Schanz, MD;  Location: [facility] ORS;  Service: General;;  Umbilical   sphincterotomy  2006   lateral internal sphincterotomy for anal fissure   XI ROBOTIC ASSISTED VENTRAL HERNIA N/A 03/15/2023   Procedure: XI ROBOTIC ASSISTED UMBILICAL REPAIR HERNIA with plication of diastasis recti;  Surgeon: Desiderio Schanz, MD;  Location: [facility] ORS;  Service: General;   Laterality: N/A;  umbilical    Family History No h/o GI disease or malignancy  Review of Systems  Constitutional:  Negative for activity change, appetite change, chills, diaphoresis, fatigue, fever and unexpected weight change.  HENT:  Negative for trouble swallowing and voice change.   Respiratory:  Negative for shortness of breath and wheezing.   Cardiovascular:  Negative for chest pain, palpitations and leg swelling.  Gastrointestinal:  Negative for abdominal distention, abdominal pain, anal bleeding, blood in stool, constipation, diarrhea, nausea and vomiting.  Musculoskeletal:  Negative for arthralgias and myalgias.  Skin:  Negative for color change and pallor.  Neurological:  Negative for dizziness, syncope and weakness.  Psychiatric/Behavioral:  Negative for confusion. The patient is not nervous/anxious.   All other systems reviewed and are negative.    Medications No current facility-administered medications on file prior to encounter.   Current Outpatient Medications on File Prior to Encounter  Medication Sig Dispense Refill   acetaminophen  (TYLENOL ) 500 MG tablet Take 2 tablets (1,000 mg total) by mouth every 6 (six) hours as needed for mild pain (pain score 1-3).     psyllium (METAMUCIL) 58.6 % powder Take 1 packet by mouth daily.     pantoprazole  (PROTONIX ) 40 MG tablet Take 1 tablet (40 mg total) by mouth 2 (two) times daily. 60 tablet 1    Pertinent medications related to GI and procedure were reviewed by me with the patient prior to the procedure   Current Facility-Administered Medications:    0.9 %  sodium chloride  infusion, , Intravenous, Continuous,  Onita Elspeth Sharper, DO, Last Rate: 20 mL/hr at 10/19/23 1049, New Bag at 10/19/23 1049  sodium chloride  20 mL/hr at 10/19/23 1049       No Known Allergies Allergies were reviewed by me prior to the procedure  Objective   Body mass index is 24.31 kg/m. Vitals:   10/19/23 1044  BP: 136/88  Pulse: (!) 104   Resp: 18  Temp: 98.1 F (36.7 C)  TempSrc: Oral  SpO2: 99%  Weight: 64.2 kg  Height: 5' 4 (1.626 m)     Physical Exam Vitals and nursing note reviewed.  Constitutional:      General: He is not in acute distress.    Appearance: Normal appearance. He is not ill-appearing, toxic-appearing or diaphoretic.  HENT:     Head: Normocephalic and atraumatic.     Nose: Nose normal.     Mouth/Throat:     Mouth: Mucous membranes are moist.     Pharynx: Oropharynx is clear.  Eyes:     General: No scleral icterus.    Extraocular Movements: Extraocular movements intact.  Cardiovascular:     Rate and Rhythm: Regular rhythm. Tachycardia present.     Heart sounds: Normal heart sounds. No murmur heard.    No friction rub. No gallop.  Pulmonary:     Effort: Pulmonary effort is normal. No respiratory distress.     Breath sounds: Normal breath sounds. No wheezing, rhonchi or rales.  Abdominal:     General: Bowel sounds are normal. There is no distension.     Palpations: Abdomen is soft.     Tenderness: There is no abdominal tenderness. There is no guarding or rebound.  Musculoskeletal:     Cervical back: Neck supple.     Right lower leg: No edema.     Left lower leg: No edema.  Skin:    General: Skin is warm and dry.     Coloration: Skin is not jaundiced or pale.  Neurological:     General: No focal deficit present.     Mental Status: He is alert and oriented to person, place, and time. Mental status is at baseline.  Psychiatric:        Mood and Affect: Mood normal.        Behavior: Behavior normal.        Thought Content: Thought content normal.        Judgment: Judgment normal.      Assessment:  [name] is a 48 y.o. male  who presents today for Colonoscopy for colorectal cancer screening .  Plan:  Colonoscopy with possible intervention today  Colonoscopy with possible biopsy, control of bleeding, polypectomy, and interventions as necessary has been discussed  with the patient/patient representative. Informed consent was obtained from the patient/patient representative after explaining the indication, nature, and risks of the procedure including but not limited to death, bleeding, perforation, missed neoplasm/lesions, cardiorespiratory compromise, and reaction to medications. Opportunity for questions was given and appropriate answers were provided. Patient/patient representative has verbalized understanding is amenable to undergoing the procedure.   Elspeth Sharper Onita, DO  [facility] Gastroenterology  Portions of the record may have been created with voice recognition software. Occasional wrong-word or 'sound-a-like' substitutions may have occurred due to the inherent limitations of voice recognition software.  Read the chart carefully and recognize, using context, where substitutions may have occurred.

## 2023-10-19 NOTE — Transfer of Care (Signed)
 Immediate Anesthesia Transfer of Care Note  Patient: Justin Short  Procedure(s) Performed: COLONOSCOPY POLYPECTOMY, INTESTINE  Patient Location: PACU  Anesthesia Type:General  Level of Consciousness: sedated  Airway & Oxygen Therapy: Patient Spontanous Breathing  Post-op Assessment: Report given to RN and Post -op Vital signs reviewed and stable  Post vital signs: Reviewed and stable  Last Vitals:  Vitals Value Taken Time  BP    Temp    Pulse    Resp    SpO2      Last Pain:  Vitals:   10/19/23 1044  TempSrc: Oral  PainSc: 0-No pain         Complications: No notable events documented.

## 2023-10-19 NOTE — Anesthesia Preprocedure Evaluation (Addendum)
 Anesthesia Evaluation  Patient identified by MRN, date of birth, ID band Patient awake    Reviewed: Allergy & Precautions, NPO status , Patient's Chart, lab work & pertinent test results  History of Anesthesia Complications (+) PROLONGED EMERGENCE and history of anesthetic complications  Airway Mallampati: III  TM Distance: >3 FB Neck ROM: full    Dental  (+) Chipped, Poor Dentition   Pulmonary neg shortness of breath, Current Smoker   Pulmonary exam normal        Cardiovascular Exercise Tolerance: Good negative cardio ROS Normal cardiovascular exam     Neuro/Psych  Neuromuscular disease  negative psych ROS   GI/Hepatic negative GI ROS,neg GERD  ,,(+)     substance abuse  marijuana use  Endo/Other  negative endocrine ROS    Renal/GU Renal disease  negative genitourinary   Musculoskeletal   Abdominal   Peds  Hematology negative hematology ROS (+)   Anesthesia Other Findings Past Medical History: No date: Anal fissure No date: Anxiety     Comment:  no meds No date: Arthritis No date: Hemorrhoids     Comment:  always present No date: Kidney disease     Comment:  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)but when he went back to pcp said it looks as               though pt has both functioning kidneys  Past Surgical History: 03/25/2023: ESOPHAGOGASTRODUODENOSCOPY (EGD) WITH PROPOFOL ; N/A     Comment:  Procedure: ESOPHAGOGASTRODUODENOSCOPY (EGD) WITH               PROPOFOL ;  Surgeon: Maryruth Ole DASEN, MD;  Location:               [facility] ENDOSCOPY;  Service: Endoscopy;  Laterality: N/A; 04/11/2021: EVALUATION UNDER ANESTHESIA WITH HEMORRHOIDECTOMY; N/A     Comment:  Procedure: EXAM UNDER ANESTHESIA WITH HEMORRHOIDECTOMY;               Surgeon: Desiderio Schanz, MD;  Location: [facility] ORS;                Service: General;  Laterality: N/A; No date: GANGLION CYST EXCISION; Left      Comment:  pinky finger 03/25/2023: HOT HEMOSTASIS     Comment:  Procedure: HOT HEMOSTASIS (ARGON PLASMA               COAGULATION/BICAP);  Surgeon: Maryruth Ole DASEN, MD;                Location: [facility] ENDOSCOPY;  Service: Endoscopy;; 03/15/2023: INSERTION OF MESH     Comment:  Procedure: INSERTION OF MESH;  Surgeon: Desiderio Schanz,               MD;  Location: [facility] ORS;  Service: General;;  Umbilical 2006: sphincterotomy     Comment:  lateral internal sphincterotomy for anal fissure 03/15/2023: XI ROBOTIC ASSISTED VENTRAL HERNIA; N/A     Comment:  Procedure: XI ROBOTIC ASSISTED UMBILICAL REPAIR HERNIA               with plication of diastasis recti;  Surgeon: Desiderio Schanz, MD;  Location: [facility] ORS;  Service: General;                Laterality: N/A;  umbilical  BMI  Body Mass Index: 24.31 kg/m      Reproductive/Obstetrics negative OB ROS                              Anesthesia Physical Anesthesia Plan  ASA: 2  Anesthesia Plan: General   Post-op Pain Management:    Induction: Intravenous  PONV Risk Score and Plan: Propofol  infusion and TIVA  Airway Management Planned: Natural Airway and Nasal Cannula  Additional Equipment:   Intra-op Plan:   Post-operative Plan:   Informed Consent: I have reviewed the patients History and Physical, chart, labs and discussed the procedure including the risks, benefits and alternatives for the proposed anesthesia with the patient or authorized representative who has indicated his/her understanding and acceptance.     Dental Advisory Given  Plan Discussed with: Anesthesiologist, CRNA and Surgeon  Anesthesia Plan Comments: (Patient consented for risks of anesthesia including but not limited to:  - adverse reactions to medications - risk of airway placement if required - damage to eyes, teeth, lips or other oral mucosa - nerve damage due to positioning  - sore throat or hoarseness - Damage  to heart, brain, nerves, lungs, other parts of body or loss of life  Patient voiced understanding and assent.)         Anesthesia Quick Evaluation

## 2023-10-21 NOTE — Anesthesia Postprocedure Evaluation (Signed)
 Anesthesia Post Note  Patient: Justin Short  Procedure(s) Performed: COLONOSCOPY POLYPECTOMY, INTESTINE  Patient location during evaluation: Endoscopy Anesthesia Type: General Level of consciousness: awake and alert Pain management: pain level controlled Vital Signs Assessment: post-procedure vital signs reviewed and stable Respiratory status: spontaneous breathing, nonlabored ventilation and respiratory function stable Cardiovascular status: blood pressure returned to baseline and stable Postop Assessment: no apparent nausea or vomiting Anesthetic complications: no   No notable events documented.   Last Vitals:  Vitals:   10/19/23 1212 10/19/23 1222  BP: 103/72 107/83  Pulse: 75 70  Resp: 19 15  Temp:    SpO2: 98% 98%    Last Pain:  Vitals:   10/19/23 1222  TempSrc:   PainSc: 0-No pain                 Fairy POUR Asjia Berrios

## 2023-10-22 LAB — SURGICAL PATHOLOGY

## 2023-10-25 ENCOUNTER — Ambulatory Visit (INDEPENDENT_AMBULATORY_CARE_PROVIDER_SITE_OTHER): Admitting: Internal Medicine

## 2023-10-25 ENCOUNTER — Encounter: Payer: Self-pay | Admitting: Internal Medicine

## 2023-10-25 VITALS — BP 110/72 | Ht 64.0 in | Wt 144.0 lb

## 2023-10-25 DIAGNOSIS — R37 Sexual dysfunction, unspecified: Secondary | ICD-10-CM

## 2023-10-25 DIAGNOSIS — G479 Sleep disorder, unspecified: Secondary | ICD-10-CM

## 2023-10-25 DIAGNOSIS — Z905 Acquired absence of kidney: Secondary | ICD-10-CM | POA: Diagnosis not present

## 2023-10-25 DIAGNOSIS — R590 Localized enlarged lymph nodes: Secondary | ICD-10-CM | POA: Diagnosis not present

## 2023-10-25 DIAGNOSIS — E559 Vitamin D deficiency, unspecified: Secondary | ICD-10-CM

## 2023-10-25 DIAGNOSIS — E538 Deficiency of other specified B group vitamins: Secondary | ICD-10-CM

## 2023-10-25 DIAGNOSIS — R5383 Other fatigue: Secondary | ICD-10-CM | POA: Diagnosis not present

## 2023-10-25 DIAGNOSIS — Z0001 Encounter for general adult medical examination with abnormal findings: Secondary | ICD-10-CM

## 2023-10-25 DIAGNOSIS — Z7184 Encounter for health counseling related to travel: Secondary | ICD-10-CM

## 2023-10-25 DIAGNOSIS — Z2839 Other underimmunization status: Secondary | ICD-10-CM

## 2023-10-25 NOTE — Progress Notes (Signed)
 Subjective:    Patient ID: Justin Short, male    DOB: 08/01/1975, 48 y.o.   MRN: [id number]  HPI  Patient presents to clinic today for his annual exam.  He also has multiple concerns.  1-he has felt a bump in his right ear.  He reports this area is tender to touch.  He has not noticed any redness, swelling, drainage from the ears or difficulty hearing. 2-he reports knots in his bilateral groin.  He has noticed this for about the last 18 months.  The lumps have not gotten bigger in size.  They are tender with palpation.  He has not noticed any redness in the area.  He denies any urinary symptoms.  He reports he has not been sexually active in a few years. 3-he needs a referral to nephrology.  He has a single solitary kidney and has not had any follow-up on this. 4-he reports sleep disturbance and fatigue.  He reports when he had his colonoscopy, they advised him that his breathing was disordered and that they recommended that he have a sleep study.  He is unsure if he snores however he does not always feel rested when he wakes up and he feels fatigued all the time.  He is unsure if he has a testosterone  or vitamin deficiency. 5-he will be traveling to [place], [place] and [place] in November.  He needs to know what travel vaccines he needs.  Flu: 12/2016 Tetanus: 08/2016 COVID: Never Colon screening: 09/2023 Vision screening: as needed Dentist: as needed  Diet: He does eat meat. He consumes fruits and veggies. He does eat some fried foods. He drinks mostly soda and water. Exercise: pickleball  Review of Systems     Past Medical History:  Diagnosis Date   Anal fissure    Anxiety    no meds   Arthritis    Hemorrhoids    always present   Kidney disease    pt reports only one functional kidney (dx in 6th grade)but when he went back to pcp said it looks as though pt has both functioning kidneys    Current Outpatient Medications  Medication Sig Dispense Refill   acetaminophen   (TYLENOL ) 500 MG tablet Take 2 tablets (1,000 mg total) by mouth every 6 (six) hours as needed for mild pain (pain score 1-3).     pantoprazole  (PROTONIX ) 40 MG tablet Take 1 tablet (40 mg total) by mouth 2 (two) times daily. 60 tablet 1   psyllium (METAMUCIL) 58.6 % powder Take 1 packet by mouth daily.     No current facility-administered medications for this visit.    No Known Allergies  Family History  Problem Relation Age of Onset   Cancer Mother        skin   Diabetes Father    Heart disease Father        CHF   Cancer Father        renal   Stroke Father    Kidney disease Father        dialysis s/p kidney surgery ? cancer   Thyroid disease Paternal Aunt     Social History   Socioeconomic History   Marital status: Single    Spouse name: Not on file   Number of children: Not on file   Years of education: Not on file   Highest education level: Not on file  Occupational History   Not on file  Tobacco Use   Smoking status: Some Days  Current packs/day: 0.50    Average packs/day: 0.5 packs/day for 23.0 years (11.5 ttl pk-yrs)    Types: Cigarettes, Cigars    Start date: 03/28/2020    Passive exposure: Past   Smokeless tobacco: Never  Vaping Use   Vaping status: Every Day   Substances: THC, Flavoring  Substance and Sexual Activity   Alcohol use: Yes    Comment: occ   Drug use: Yes    Frequency: 10.0 times per week    Types: Marijuana    Comment: smokes small amount each occurrence 2-3 g / week   Sexual activity: Yes    Birth control/protection: None    Comment: mutually monogamous male partner (w/ hysterectomy)  Other Topics Concern   Not on file  Social History Narrative   Not on file   Social Drivers of Health   Financial Resource Strain: High Risk (04/17/2023)   Received from [facility]   Overall Financial Resource Strain (CARDIA)    Difficulty of Paying Living Expenses: Hard  Food Insecurity: Food Insecurity Present (04/17/2023)    Received from [facility]   Hunger Vital Sign    Within the past 12 months, you worried that your food would run out before you got the money to buy more.: Sometimes true    Within the past 12 months, the food you bought just didn't last and you didn't have money to get more.: Never true  Transportation Needs: No Transportation Needs (04/17/2023)   Received from [facility] - Transportation    In the past 12 months, has lack of transportation kept you from medical appointments or from getting medications?: No    Lack of Transportation (Non-Medical): No  Physical Activity: Not on file  Stress: Not on file  Social Connections: Not on file  Intimate Partner Violence: Not At Risk (03/27/2023)   Humiliation, Afraid, Rape, and Kick questionnaire    Fear of Current or Ex-Partner: No    Emotionally Abused: No    Physically Abused: No    Sexually Abused: No     Constitutional: Patient reports fatigue.  Denies fever, malaise, headache or abrupt weight changes.  HEENT: Patient reports bump in right ear.  Denies eye pain, eye redness, ear pain, ringing in the ears, wax buildup, runny nose, nasal congestion, bloody nose, or sore throat. Respiratory: Denies difficulty breathing, shortness of breath, cough or sputum production.   Cardiovascular: Denies chest pain, chest tightness, palpitations or swelling in the hands or feet.  Gastrointestinal: Denies abdominal pain, bloating, constipation, diarrhea or blood in the stool.  GU: Patient reports sexual dysfunction.  Denies urgency, frequency, pain with urination, burning sensation, blood in urine, odor or discharge. Musculoskeletal: Denies decrease in range of motion, difficulty with gait, muscle pain or joint pain and swelling.  Skin: Pt reports knot in bilateral groin. Denies redness, rashes, lesions or ulcercations.  Neurological: Pt reports intermittent paresthesia of upper and lower extremities, sleep  disturbance. Denies dizziness, difficulty with memory, difficulty with speech or problems with balance and coordination.  Psych: Patient has a history of anxiety and depression.  Denies SI/HI.  No other specific complaints in a complete review of systems (except as listed in HPI above).  Objective:   Physical Exam  BP 110/72 (BP Location: Left Arm, Patient Position: Sitting, Cuff Size: Normal)   Ht 5' 4 (1.626 m)   Wt 144 lb (65.3 kg)   BMI 24.72 kg/m   Wt Readings from Last  3 Encounters:  10/25/23 144 lb (65.3 kg)  10/19/23 141 lb 9.6 oz (64.2 kg)  06/13/23 145 lb 3.2 oz (65.9 kg)    General: Appears his stated age, well developed, well nourished in NAD. Skin: Warm, dry and intact. No rashes noted.  Bilateral inguinal lymphadenopathy noted. HEENT: Head: normal shape and size; Eyes: sclera white, no icterus, conjunctiva pink, PERRLA and EOMs intact; Right Ear: Scabbed papule noted of the posterior tragus, TM gray and intact, normal light reflex. Neck:  Neck supple, trachea midline. No masses, lumps or thyromegaly present.  Cardiovascular: Normal rate and rhythm. S1,S2 noted.  No murmur, rubs or gallops noted. No JVD or BLE edema.  Pulmonary/Chest: Normal effort and positive vesicular breath sounds. No respiratory distress. No wheezes, rales or ronchi noted.  Abdomen:  Normal bowel sounds.  Musculoskeletal: Strength 5/5 BUE/BLE. No difficulty with gait.  Neurological: Alert and oriented. Cranial nerves II-XII grossly intact. Coordination normal.  Psychiatric: Mood and affect normal. Behavior is normal. Judgment and thought content normal.     BMET    Component Value Date/Time   NA 133 (L) 03/27/2023 0512   K 3.9 03/27/2023 0512   CL 99 03/27/2023 0512   CO2 25 03/27/2023 0512   GLUCOSE 100 (H) 03/27/2023 0512   BUN 7 03/27/2023 0512   CREATININE 0.83 03/27/2023 0512   CREATININE 0.85 08/30/2022 1159   CALCIUM 7.9 (L) 03/27/2023 0512   GFRNONAA >60 03/27/2023 0512     Lipid Panel     Component Value Date/Time   CHOL 151 04/28/2022 1052   TRIG 46 04/28/2022 1052   HDL 50 04/28/2022 1052   CHOLHDL 3.0 04/28/2022 1052   VLDL 7 09/13/2016 0818   LDLCALC 87 04/28/2022 1052    CBC    Component Value Date/Time   WBC 12.7 (H) 03/27/2023 0512   RBC 2.60 (L) 03/27/2023 0512   HGB 8.3 (L) 03/27/2023 0512   HCT 23.5 (L) 03/27/2023 0512   PLT 420 (H) 03/27/2023 0512   MCV 90.4 03/27/2023 0512   MCH 31.9 03/27/2023 0512   MCHC 35.3 03/27/2023 0512   RDW 15.4 03/27/2023 0512   LYMPHSABS 2.2 03/27/2023 0512   MONOABS 1.0 03/27/2023 0512   EOSABS 0.3 03/27/2023 0512   BASOSABS 0.0 03/27/2023 0512    Hgb A1C Lab Results  Component Value Date   HGBA1C 5.1 10/12/2020             Assessment & Plan:   Preventative Health Maintenance:  Encouraged him to get a flu shot in the fall Tetanus UTD Encouraged him to get his COVID-vaccine Colon screening UTD Encouraged him to consume a balanced diet and exercise regimen Advised him to see an eye doctor and dentist annually We will check CBC, c-Met, lipid and A1c today  Inguinal lymphadenopathy:  Not overly concerning given the size, smoothness and mobility of the lymph nodes We will check RPR  Solitary kidney:  Referral to nephrology for further evaluation and treatment  Sleep disturbance,, sexual dysfunction, fatigue:  Referral to sleep studies for further evaluation and treatment Will check vitamin D , B12, TSH and testosterone  today  Encounter for travel counseling:  Reviewed CDC travel website for recommended vaccines He does not have an immunization record Tetanus UTD Will check MMR, hep A, hep B, varicella and polio titer Will provide Rx for Malarone 250/100 milligrams, start 1 to 2 days prior to travel in 7 days after He will need to go to the health department for typhoid prophylaxis.  RTC in 1 year, sooner if needed Angeline Laura, NP

## 2023-10-25 NOTE — Patient Instructions (Signed)
 Health Maintenance, Male  Adopting a healthy lifestyle and getting preventive care are important in promoting health and wellness. Ask your health care provider about:  The right schedule for you to have regular tests and exams.  Things you can do on your own to prevent diseases and keep yourself healthy.  What should I know about diet, weight, and exercise?  Eat a healthy diet    Eat a diet that includes plenty of vegetables, fruits, low-fat dairy products, and lean protein.  Do not eat a lot of foods that are high in solid fats, added sugars, or sodium.  Maintain a healthy weight  Body mass index (BMI) is a measurement that can be used to identify possible weight problems. It estimates body fat based on height and weight. Your health care provider can help determine your BMI and help you achieve or maintain a healthy weight.  Get regular exercise  Get regular exercise. This is one of the most important things you can do for your health. Most adults should:  Exercise for at least 150 minutes each week. The exercise should increase your heart rate and make you sweat (moderate-intensity exercise).  Do strengthening exercises at least twice a week. This is in addition to the moderate-intensity exercise.  Spend less time sitting. Even light physical activity can be beneficial.  Watch cholesterol and blood lipids  Have your blood tested for lipids and cholesterol at 47 years of age, then have this test every 5 years.  You may need to have your cholesterol levels checked more often if:  Your lipid or cholesterol levels are high.  You are older than 47 years of age.  You are at high risk for heart disease.  What should I know about cancer screening?  Many types of cancers can be detected early and may often be prevented. Depending on your health history and family history, you may need to have cancer screening at various ages. This may include screening for:  Colorectal cancer.  Prostate cancer.  Skin cancer.  Lung  cancer.  What should I know about heart disease, diabetes, and high blood pressure?  Blood pressure and heart disease  High blood pressure causes heart disease and increases the risk of stroke. This is more likely to develop in people who have high blood pressure readings or are overweight.  Talk with your health care provider about your target blood pressure readings.  Have your blood pressure checked:  Every 3-5 years if you are 9-95 years of age.  Every year if you are 85 years old or older.  If you are between the ages of 29 and 29 and are a current or former smoker, ask your health care provider if you should have a one-time screening for abdominal aortic aneurysm (AAA).  Diabetes  Have regular diabetes screenings. This checks your fasting blood sugar level. Have the screening done:  Once every three years after age 23 if you are at a normal weight and have a low risk for diabetes.  More often and at a younger age if you are overweight or have a high risk for diabetes.  What should I know about preventing infection?  Hepatitis B  If you have a higher risk for hepatitis B, you should be screened for this virus. Talk with your health care provider to find out if you are at risk for hepatitis B infection.  Hepatitis C  Blood testing is recommended for:  Everyone born from [birth year] through 1964.  Anyone  with known risk factors for hepatitis C.  Sexually transmitted infections (STIs)  You should be screened each year for STIs, including gonorrhea and chlamydia, if:  You are sexually active and are younger than 47 years of age.  You are older than 47 years of age and your health care provider tells you that you are at risk for this type of infection.  Your sexual activity has changed since you were last screened, and you are at increased risk for chlamydia or gonorrhea. Ask your health care provider if you are at risk.  Ask your health care provider about whether you are at high risk for HIV. Your health care provider  may recommend a prescription medicine to help prevent HIV infection. If you choose to take medicine to prevent HIV, you should first get tested for HIV. You should then be tested every 3 months for as long as you are taking the medicine.  Follow these instructions at home:  Alcohol use  Do not drink alcohol if your health care provider tells you not to drink.  If you drink alcohol:  Limit how much you have to 0-2 drinks a day.  Know how much alcohol is in your drink. In the [place] one drink equals one 12 oz bottle of beer (355 mL), one 5 oz glass of wine (148 mL), or one 1 oz glass of hard liquor (44 mL).  Lifestyle  Do not use any products that contain nicotine or tobacco. These products include cigarettes, chewing tobacco, and vaping devices, such as e-cigarettes. If you need help quitting, ask your health care provider.  Do not use street drugs.  Do not share needles.  Ask your health care provider for help if you need support or information about quitting drugs.  General instructions  Schedule regular health, dental, and eye exams.  Stay current with your vaccines.  Tell your health care provider if:  You often feel depressed.  You have ever been abused or do not feel safe at home.  Summary  Adopting a healthy lifestyle and getting preventive care are important in promoting health and wellness.  Follow your health care provider's instructions about healthy diet, exercising, and getting tested or screened for diseases.  Follow your health care provider's instructions on monitoring your cholesterol and blood pressure.  This information is not intended to replace advice given to you by your health care provider. Make sure you discuss any questions you have with your health care provider.  Document Revised: 08/02/2020 Document Reviewed: 08/02/2020  Elsevier Patient Education  2024 [employer].

## 2023-10-26 ENCOUNTER — Ambulatory Visit: Payer: Self-pay | Admitting: Internal Medicine

## 2023-10-30 LAB — LIPID PANEL
Cholesterol: 131 mg/dL (ref <200)
HDL: 47 mg/dL (ref >=40)
LDL Cholesterol (Calc): 74 mg/dL
Non-HDL Cholesterol (Calc): 84 mg/dL (ref <130)
Total CHOL/HDL Ratio: 2.8 (calc) (ref <5.0)
Triglycerides: 36 mg/dL (ref <150)

## 2023-10-30 LAB — CBC WITH DIFFERENTIAL/PLATELET
Absolute Lymphocytes: 1831 {cells}/uL (ref 850–3900)
Absolute Monocytes: 454 {cells}/uL (ref 200–950)
Basophils Absolute: 31 {cells}/uL (ref 0–200)
Basophils Relative: 0.6 %
Eosinophils Absolute: 143 {cells}/uL (ref 15–500)
Eosinophils Relative: 2.8 %
HCT: 40.5 % (ref 38.5–50.0)
Hemoglobin: 13.3 g/dL (ref 13.2–17.1)
MCH: 31.1 pg (ref 27.0–33.0)
MCHC: 32.8 g/dL (ref 32.0–36.0)
MCV: 94.8 fL (ref 80.0–100.0)
MPV: 11.3 fL (ref 7.5–12.5)
Monocytes Relative: 8.9 %
Neutro Abs: 2642 {cells}/uL (ref 1500–7800)
Neutrophils Relative %: 51.8 %
Platelets: 256 Thousand/uL (ref 140–400)
RBC: 4.27 Million/uL (ref 4.20–5.80)
RDW: 13.4 % (ref 11.0–15.0)
Total Lymphocyte: 35.9 %
WBC: 5.1 Thousand/uL (ref 3.8–10.8)

## 2023-10-30 LAB — HEPATITIS A ANTIBODY, TOTAL: Hepatitis A AB,Total: NONREACTIVE

## 2023-10-30 LAB — MEASLES/MUMPS/RUBELLA IMMUNITY
Mumps IgG: 266 [AU]/ml
Rubella: 4.22 {index}
Rubeola IgG: >300 [AU]/ml

## 2023-10-30 LAB — VITAMIN D 25 HYDROXY (VIT D DEFICIENCY, FRACTURES): Vit D, 25-Hydroxy: 35 ng/mL (ref 30–100)

## 2023-10-30 LAB — HEMOGLOBIN A1C
Hgb A1c MFr Bld: 5.4 % (ref <5.7)
Mean Plasma Glucose: 108 mg/dL
eAG (mmol/L): 6 mmol/L

## 2023-10-30 LAB — POLIOVIRUS (1,3) ABS, NEUTRALIZ.
Polio 1 Titer: >1:128 {titer}
Polio 3 Titer: >1:128 {titer}

## 2023-10-30 LAB — VARICELLA-ZOSTER VIRUS AB(IMMUNITY SCREEN),ACIF,SERUM: VARICELLA ZOSTER VIRUS AB (IMMUNITY SCR),ACIF SERUM: >=1:4 {titer}

## 2023-10-30 LAB — RPR: RPR Ser Ql: NONREACTIVE

## 2023-10-30 LAB — TESTOSTERONE: Testosterone: 440 ng/dL (ref 250–827)

## 2023-10-30 LAB — VITAMIN B12: Vitamin B-12: 233 pg/mL (ref 200–1100)

## 2023-10-30 LAB — TSH: TSH: 1.27 m[IU]/L (ref 0.40–4.50)

## 2023-10-30 LAB — HEPATITIS B SURFACE ANTIBODY,QUALITATIVE: Hep B S Ab: NONREACTIVE

## 2024-03-04 ENCOUNTER — Encounter: Payer: Self-pay | Admitting: Internal Medicine

## 2024-03-04 ENCOUNTER — Ambulatory Visit: Admitting: Internal Medicine

## 2024-03-04 VITALS — BP 128/78 | Ht 64.0 in | Wt 146.6 lb

## 2024-03-04 DIAGNOSIS — R590 Localized enlarged lymph nodes: Secondary | ICD-10-CM

## 2024-03-04 DIAGNOSIS — G4486 Cervicogenic headache: Secondary | ICD-10-CM

## 2024-03-04 DIAGNOSIS — J069 Acute upper respiratory infection, unspecified: Secondary | ICD-10-CM

## 2024-03-04 MED ORDER — METHYLPREDNISOLONE ACETATE 80 MG/ML IJ SUSP
80.0000 mg | Freq: Once | INTRAMUSCULAR | Status: AC
  Administered 2024-03-04: 80 mg via INTRAMUSCULAR

## 2024-03-04 NOTE — Patient Instructions (Signed)
 Lymphadenopathy  Lymphadenopathy is when your lymph glands are swollen or larger than normal.  Lymph glands, also called lymph nodes, are clumps of tissue. They filter germs and waste from tissues in your body to your bloodstream. They're part of your body's defense system, or immune system. Lymphadenopathy has different causes, like infection, autoimmune disease, and cancer. Lymphadenopathy can happen wherever you have lymph nodes. The type you have depends on which nodes it's in, such as: Cervical lymphadenopathy. This is in the neck. Mediastinal lymphadenopathy. This is in the chest. Hilar lymphadenopathy. This is in the lungs. Axillary lymphadenopathy. This is in the armpits. Inguinal lymphadenopathy. This is in the groin. Sometimes, fluid and cells that fight infection build up in your lymph nodes. This happens when your immune system reacts to germs or other substances that get into your body. This makes lymph nodes swell and get bigger. Treatment is based on what's thought to be the cause. Sometimes, lymph nodes don't go back to normal size after treatment. If yours don't, your health care provider may order tests to help learn why your glands are still swollen and big. Follow these instructions at home:  Take over-the-counter and prescription medicines only as told by your provider. If you were prescribed antibiotics, do not stop using them, even if you start to feel better. If told, apply heat to swollen lymph nodes as told by your provider. Use the heat source that your provider recommends, such as a moist heat pack or a heating pad. Place a towel between your skin and the heat source. Leave the heat on only for the time told by your provider to avoid injury. If your skin turns bright red, remove the heat right away to prevent burns. The risk of burns is higher if you cannot feel pain, heat, or cold. Check your swollen lymph nodes every day for changes. Check other places where you have  lymph nodes as told. Check for changes such as: More swelling. Sudden growth in size. Redness or pain. Hardness. Contact a health care provider if: You have lymph nodes that: Are still swollen after 2 weeks. Have gotten bigger all of a sudden or the swelling spreads. Are red, painful, or hard. Fluid leaks from the skin near a swollen lymph node. You get a fever, chills, or night sweats. You feel tired. You have a sore throat. Your abdomen hurts. You lose weight without trying. This information is not intended to replace advice given to you by your health care provider. Make sure you discuss any questions you have with your health care provider. Document Revised: 06/07/2022 Document Reviewed: 06/07/2022 Elsevier Patient Education  2024 [employer].

## 2024-03-04 NOTE — Addendum Note (Signed)
 Addended by: ZELIA GAUZE D on: 03/04/2024 03:12 PM   Modules accepted: Orders

## 2024-03-04 NOTE — Progress Notes (Signed)
 Subjective:    Patient ID: Justin Short, male    DOB: 1975/11/30, 48 y.o.   MRN: [id number]  HPI  Discussed the use of AI scribe software for clinical note transcription with the patient, who gave verbal consent to proceed.  Justin Short is a 48 year old male who presents with fatigue, headache, and sore glands.  He has been experiencing fatigue, headache, and sore glands since yesterday. The fatigue has worsened today, initially attributing it to returning to work. The headache is located at the occipital region, which he believes may be related to his chronic neck pain, as he often experiences neck pain that progresses to a headache during the day. He describes his neck as always hurting.  He reports eye discomfort over the past four to five days, feeling the need to apply pressure to his eyes. No dizziness, photophobia, phonophobia, nausea, or cognitive fog, which are typically associated with migraines.  He mentions a slight rhinorrhea and a recent incident of epistaxis after picking a crusty area in his nose. He was seen at urgent care where he was given antibiotic ointment for his nose, as the provider noted no infection but mentioned MRSA concerns. He denies having an upper respiratory infection but has a productive cough, which he questions may be related to smoking, as he has recently resumed smoking.  He has ordered a new neck pillow to aid with sleep and alleviate neck pain. He previously visited a chiropractor but stopped after suspecting it contributed to a hernia.       Review of Systems     Past Medical History:  Diagnosis Date   Anal fissure    Anxiety    no meds   Arthritis    Hemorrhoids    always present   Kidney disease    pt reports only one functional kidney (dx in 6th grade)but when he went back to pcp said it looks as though pt has both functioning kidneys    No current outpatient medications on file.   No current facility-administered  medications for this visit.    No Known Allergies  Family History  Problem Relation Age of Onset   Cancer Mother        skin   Diabetes Father    Heart disease Father        CHF   Cancer Father        renal   Stroke Father    Kidney disease Father        dialysis s/p kidney surgery ? cancer   Thyroid disease Paternal Aunt     Social History   Socioeconomic History   Marital status: Single    Spouse name: Not on file   Number of children: Not on file   Years of education: Not on file   Highest education level: Not on file  Occupational History   Not on file  Tobacco Use   Smoking status: Some Days    Current packs/day: 0.50    Average packs/day: 0.5 packs/day for 23.3 years (11.7 ttl pk-yrs)    Types: Cigarettes, Cigars    Start date: 03/28/2020    Passive exposure: Past   Smokeless tobacco: Never  Vaping Use   Vaping status: Every Day   Substances: THC, Flavoring  Substance and Sexual Activity   Alcohol use: Yes    Comment: occ   Drug use: Yes    Frequency: 10.0 times per week    Types: Marijuana  Comment: smokes small amount each occurrence 2-3 g / week   Sexual activity: Yes    Birth control/protection: None    Comment: mutually monogamous male partner (w/ hysterectomy)  Other Topics Concern   Not on file  Social History Narrative   Not on file   Social Drivers of Health   Financial Resource Strain: High Risk (04/17/2023)   Received from [facility]   Overall Financial Resource Strain (CARDIA)    Difficulty of Paying Living Expenses: Hard  Food Insecurity: Food Insecurity Present (04/17/2023)   Received from [facility]   Hunger Vital Sign    Within the past 12 months, you worried that your food would run out before you got the money to buy more.: Sometimes true    Within the past 12 months, the food you bought just didn't last and you didn't have money to get more.: Never true  Transportation Needs: No  Transportation Needs (04/17/2023)   Received from [facility] - Transportation    In the past 12 months, has lack of transportation kept you from medical appointments or from getting medications?: No    Lack of Transportation (Non-Medical): No  Physical Activity: Not on file  Stress: Not on file  Social Connections: Not on file  Intimate Partner Violence: Not At Risk (03/27/2023)   Humiliation, Afraid, Rape, and Kick questionnaire    Fear of Current or Ex-Partner: No    Emotionally Abused: No    Physically Abused: No    Sexually Abused: No     Constitutional: Patient reports fatigue, headache.  Denies fever, malaise, or abrupt weight changes.  HEENT: Patient reports lymph node swelling.  Denies eye pain, eye redness, ear pain, ringing in the ears, wax buildup, runny nose, nasal congestion, bloody nose, or sore throat. Respiratory: Pt reports cough. Denies difficulty breathing, shortness of breath.   Cardiovascular: Denies chest pain, chest tightness, palpitations or swelling in the hands or feet.  Gastrointestinal: Denies abdominal pain, bloating, constipation, diarrhea or blood in the stool.  GU: Patient reports sexual dysfunction.  Denies urgency, frequency, pain with urination, burning sensation, blood in urine, odor or discharge. Musculoskeletal: Denies decrease in range of motion, difficulty with gait, muscle pain or joint pain and swelling.  Skin: Denies redness, rashes, lesions or ulcercations.  Neurological: Pt reports intermittent paresthesia of upper and lower extremities, sleep disturbance. Denies dizziness, difficulty with memory, difficulty with speech or problems with balance and coordination.  Psych: Patient has a history of anxiety and depression.  Denies SI/HI.  No other specific complaints in a complete review of systems (except as listed in HPI above).  Objective:   Physical Exam  BP 128/78 (BP Location: Left Arm, Patient Position:  Sitting, Cuff Size: Normal)   Ht 5' 4 (1.626 m)   Wt 146 lb 9.6 oz (66.5 kg)   BMI 25.16 kg/m    Wt Readings from Last 3 Encounters:  10/25/23 144 lb (65.3 kg)  10/19/23 141 lb 9.6 oz (64.2 kg)  06/13/23 145 lb 3.2 oz (65.9 kg)    General: Appears his stated age, well developed, well nourished in NAD. Skin: Warm, dry and intact. No rashes noted.   HEENT: Head: normal shape and size, no sinus tenderness; Eyes: sclera white, no icterus, conjunctiva pink, PERRLA and EOMs intact; nose: Mucosa erythematous and dry, scabbed lesions noted in bilateral nare; throat: Mucosa pink and moist, + PND, no tonsillar enlargement, exudate or lesions noted. Neck:  Bilateral anterior and postauricular cervical adenopathy noted. Cardiovascular: Normal rate and rhythm. S1,S2 noted.  No murmur, rubs or gallops noted. No JVD or BLE edema.  Pulmonary/Chest: Normal effort and positive vesicular breath sounds. No respiratory distress. No wheezes, rales or ronchi noted.  Musculoskeletal: Normal flexion, extension and rotation of the cervical spine.  Bony tenderness noted of the cervical spine.  Shoulder shrug equal.  No difficulty with gait.  Neurological: Alert and oriented. Coordination normal.     BMET    Component Value Date/Time   NA 133 (L) 03/27/2023 0512   K 3.9 03/27/2023 0512   CL 99 03/27/2023 0512   CO2 25 03/27/2023 0512   GLUCOSE 100 (H) 03/27/2023 0512   BUN 7 03/27/2023 0512   CREATININE 0.83 03/27/2023 0512   CREATININE 0.85 08/30/2022 1159   CALCIUM 7.9 (L) 03/27/2023 0512   GFRNONAA >60 03/27/2023 0512    Lipid Panel     Component Value Date/Time   CHOL 131 10/25/2023 0930   TRIG 36 10/25/2023 0930   HDL 47 10/25/2023 0930   CHOLHDL 2.8 10/25/2023 0930   VLDL 7 09/13/2016 0818   LDLCALC 74 10/25/2023 0930    CBC    Component Value Date/Time   WBC 5.1 10/25/2023 0930   RBC 4.27 10/25/2023 0930   HGB 13.3 10/25/2023 0930   HCT 40.5 10/25/2023 0930   PLT 256 10/25/2023  0930   MCV 94.8 10/25/2023 0930   MCH 31.1 10/25/2023 0930   MCHC 32.8 10/25/2023 0930   RDW 13.4 10/25/2023 0930   LYMPHSABS 2.2 03/27/2023 0512   MONOABS 1.0 03/27/2023 0512   EOSABS 143 10/25/2023 0930   BASOSABS 31 10/25/2023 0930    Hgb A1C Lab Results  Component Value Date   HGBA1C 5.4 10/25/2023             Assessment & Plan:   Assessment and Plan    Viral upper respiratory infection with reactive cervical/postauricular lymphadenopathy Reactive cervical/postauricular lymphadenopathy suggests viral etiology. No antibiotics indicated. - Administered Depo Medrol  80 mg steroid injection. - Consider antibiotics if no improvement by week's end. - Discuss potential ultrasound if symptoms persist post-antibiotics.   Cervicogenic headache Chronic neck pain contributing to cervicogenic headache.  Tylenol  somewhat effective - Encouraged neck exercises - Heat massage may be helpful - 80 mg Depo-Medrol  IM x 1 for inflammation       RTC in 7 months for her annual exam Angeline Laura, NP

## 2024-03-12 ENCOUNTER — Encounter: Payer: Self-pay | Admitting: Internal Medicine

## 2024-10-31 ENCOUNTER — Encounter: Admitting: Internal Medicine
